# Patient Record
Sex: Male | Born: 1995 | Race: Asian | Hispanic: No | Marital: Single | Smoking: Never smoker
Health system: Southern US, Community
[De-identification: ages and names within clinical notes are randomized; demographics above are authoritative.]

---
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
## 2018-08-23 ENCOUNTER — Encounter (HOSPITAL_COMMUNITY): Payer: Self-pay | Admitting: Emergency Medicine

## 2018-08-23 ENCOUNTER — Ambulatory Visit (HOSPITAL_COMMUNITY)
Admission: EM | Admit: 2018-08-23 | Discharge: 2018-08-23 | Disposition: A | Discharge status: Home / Self Care | Payer: Self-pay | Attending: Family Medicine | Admitting: Family Medicine

## 2018-08-23 DIAGNOSIS — H7292 Unspecified perforation of tympanic membrane, left ear: Secondary | ICD-10-CM

## 2018-08-23 DIAGNOSIS — H6592 Unspecified nonsuppurative otitis media, left ear: Secondary | ICD-10-CM

## 2018-08-23 MED ORDER — AMOXICILLIN 875 MG PO TABS: 875.0000 mg | ORAL_TABLET | Freq: Two times a day (BID) | ORAL | 0 refills | Status: DC

## 2018-08-23 NOTE — ED Triage Notes (Signed)
Pt here for ear wax removal  

## 2018-08-23 NOTE — Discharge Instructions (Signed)
Take antibiotic 2 x a day See ENT in follow up Do not use cotton swab in ear

## 2018-08-23 NOTE — ED Provider Notes (Signed)
[facility]    CSN: [id number] Arrival date & time: 08/23/18  1052     History   Chief Complaint Chief Complaint  Patient presents with  . Cerumen Impaction    HPI Jacob Ferguson is a 22 y.o. male.   HPI  Here for ear problems has had ear infections and wax in the past.  Here for ear pain and drainage L ear for several weeks.  He thinks there is 'trash" In his ear.  He has used a cotton swab.  Family member states he has decreased hearing,  Denies recent URI or congestion. No sore throat.  No allergies.    Seen with the assistance of an interpreter    History reviewed. No pertinent past medical history.  There are no active problems to display for this patient.   History reviewed. No pertinent surgical history.     Home Medications    Prior to Admission medications   Medication Sig Start Date End Date Taking? Authorizing Provider  amoxicillin (AMOXIL) 875 MG tablet Take 1 tablet (875 mg total) by mouth 2 (two) times daily. 08/23/18   Eustace Moore, MD    Family History History reviewed. No pertinent family history. States no heart disease or cancer Social History Social History   Tobacco Use  . Smoking status: Not on file  Substance Use Topics  . Alcohol use: Not on file  . Drug use: Not on file     Allergies   Patient has no known allergies.   Review of Systems Review of Systems  Constitutional: Negative for chills and fever.  HENT: Positive for ear discharge, ear pain and hearing loss. Negative for congestion, postnasal drip, rhinorrhea and sore throat.   Eyes: Negative for pain and visual disturbance.  Respiratory: Negative for cough and shortness of breath.   Cardiovascular: Negative for chest pain and palpitations.  Gastrointestinal: Negative for abdominal pain and vomiting.  Genitourinary: Negative for dysuria and hematuria.  Musculoskeletal: Negative for arthralgias and back pain.  Skin: Negative for color change and rash.    Neurological: Negative for seizures and syncope.  Psychiatric/Behavioral: Negative for sleep disturbance.  All other systems reviewed and are negative.    Physical Exam Triage Vital Signs ED Triage Vitals  Enc Vitals Group     BP 08/23/18 1143 (!) 118/102     Pulse Rate 08/23/18 1143 81     Resp 08/23/18 1143 18     Temp 08/23/18 1143 97.9 F (36.6 C)     Temp Source 08/23/18 1143 Oral     SpO2 08/23/18 1143 99 %   No data found.  Updated Vital Signs BP (!) 118/102 (BP Location: Right Arm) Comment: checked in both arms verified  Pulse 81   Temp 97.9 F (36.6 C) (Oral)   Resp 18   SpO2 99%       Physical Exam  Constitutional: He appears well-developed and well-nourished. No distress.  HENT:  Head: Normocephalic and atraumatic.  Right Ear: External ear normal.  Left Ear: External ear normal.  Nose: Nose normal.  Mouth/Throat: Oropharynx is clear and moist.  The right TM has some scar, but is otherwise clear.  The left TM has a perforation centrally, some yellow dc in the canal.  Mild erythema.  No adenopathy.    Eyes: Pupils are equal, round, and reactive to light. Conjunctivae are normal.  Neck: Normal range of motion.  Cardiovascular: Normal rate.  Pulmonary/Chest: Effort normal. No respiratory distress.  Abdominal: Soft.  He exhibits no distension.  Musculoskeletal: Normal range of motion. He exhibits no edema.  Neurological: He is alert.  Skin: Skin is warm and dry.     UC Treatments / Results  Labs (all labs ordered are listed, but only abnormal results are displayed) Labs Reviewed - No data to display  EKG None  Radiology No results found.  Procedures Procedures (including critical care time)  Medications Ordered in UC Medications - No data to display  Initial Impression / Assessment and Plan / UC Course  I have reviewed the triage vital signs and the nursing notes.  Pertinent labs & imaging results that were available during my care of the  patient were reviewed by me and considered in my medical decision making (see chart for details).     Discussed with the patient and his father that he does not have earwax, rather he had a TM rupture.  This may have been from occult otitis media.  He states that it is painful.  He may have ruptured his eardrum with a Q-tip.  In any event I am going to cover him with antibiotics and send him to an ENT for follow-up. Final Clinical Impressions(s) / UC Diagnoses   Final diagnoses:  Otitis media, serous, tm rupture, left     Discharge Instructions     Take antibiotic 2 x a day See ENT in follow up Do not use cotton swab in ear   ED Prescriptions    Medication Sig Dispense Auth. Provider   amoxicillin (AMOXIL) 875 MG tablet Take 1 tablet (875 mg total) by mouth 2 (two) times daily. 14 tablet Eustace Moore, MD     Controlled Substance Prescriptions [place] Controlled Substance Registry consulted? Not Applicable   Eustace Moore, MD 08/23/18 1239

## 2019-07-06 ENCOUNTER — Other Ambulatory Visit: Payer: Self-pay

## 2019-07-06 DIAGNOSIS — Z20822 Contact with and (suspected) exposure to covid-19: Secondary | ICD-10-CM

## 2019-07-07 LAB — NOVEL CORONAVIRUS, NAA: SARS-CoV-2, NAA: NOT DETECTED

## 2019-07-10 ENCOUNTER — Other Ambulatory Visit: Payer: Self-pay

## 2019-07-10 DIAGNOSIS — Z20822 Contact with and (suspected) exposure to covid-19: Secondary | ICD-10-CM

## 2019-07-11 ENCOUNTER — Telehealth: Payer: Self-pay | Admitting: *Deleted

## 2019-07-11 LAB — NOVEL CORONAVIRUS, NAA: SARS-CoV-2, NAA: NOT DETECTED

## 2019-10-11 ENCOUNTER — Other Ambulatory Visit: Payer: Self-pay

## 2019-10-11 DIAGNOSIS — Z20822 Contact with and (suspected) exposure to covid-19: Secondary | ICD-10-CM

## 2019-10-13 LAB — NOVEL CORONAVIRUS, NAA: SARS-CoV-2, NAA: NOT DETECTED

## 2020-04-13 ENCOUNTER — Encounter (HOSPITAL_COMMUNITY): Payer: Self-pay

## 2020-04-13 ENCOUNTER — Ambulatory Visit (HOSPITAL_COMMUNITY)
Admission: EM | Admit: 2020-04-13 | Discharge: 2020-04-13 | Disposition: A | Discharge status: Home / Self Care | Payer: 59 | Attending: Family Medicine | Admitting: Family Medicine

## 2020-04-13 ENCOUNTER — Other Ambulatory Visit: Payer: Self-pay

## 2020-04-13 DIAGNOSIS — R519 Headache, unspecified: Secondary | ICD-10-CM | POA: Diagnosis not present

## 2020-04-13 MED ORDER — IBUPROFEN 600 MG PO TABS: 600.0000 mg | ORAL_TABLET | Freq: Four times a day (QID) | ORAL | 0 refills | Status: DC | PRN

## 2020-04-13 NOTE — Discharge Instructions (Signed)
Please try the medicine  Please stay well hydrated  Please have your eyes checked by the eye doctor if your headache continues.  Please follow up if your symptoms fail to improve.

## 2020-04-13 NOTE — ED Triage Notes (Signed)
Pt reports headache and feeling ligtheaded when walking for the past 3-4 days. Pt has not take any medication for the complaint. Pt denies any other symptoms.

## 2020-04-13 NOTE — ED Provider Notes (Signed)
[facility]    CSN: [id number] Arrival date & time: 04/13/20  1512      History   Chief Complaint Chief Complaint  Patient presents with  . Headache    HPI Jacob Ferguson is a 24 y.o. male.   He is presenting with a frontal headache.  His symptoms started 4 days ago.  His symptoms seem to be worse when he is at work.  He stands objects at work.  He denies any recent trauma.  Has tried over-the-counter medications with limited improvement.  HPI  History reviewed. No pertinent past medical history.  There are no problems to display for this patient.   History reviewed. No pertinent surgical history.     Home Medications    Prior to Admission medications   Medication Sig Start Date End Date Taking? Authorizing Provider  amoxicillin (AMOXIL) 875 MG tablet Take 1 tablet (875 mg total) by mouth 2 (two) times daily. 08/23/18   Eustace Moore, MD  ibuprofen (ADVIL) 600 MG tablet Take 1 tablet (600 mg total) by mouth every 6 (six) hours as needed. 04/13/20   Myra Rude, MD    Family History History reviewed. No pertinent family history.  Social History Social History   Tobacco Use  . Smoking status: Never Smoker  . Smokeless tobacco: Never Used  Substance Use Topics  . Alcohol use: Not on file  . Drug use: Not on file     Allergies   Patient has no known allergies.   Review of Systems Review of Systems  See HPI  Physical Exam Triage Vital Signs ED Triage Vitals  Enc Vitals Group     BP 04/13/20 1538 (!) 143/77     Pulse Rate 04/13/20 1538 84     Resp 04/13/20 1538 17     Temp 04/13/20 1538 98.3 F (36.8 C)     Temp Source 04/13/20 1538 Oral     SpO2 04/13/20 1538 98 %     Weight --      Height --      Head Circumference --      Peak Flow --      Pain Score 04/13/20 1536 4     Pain Loc --      Pain Edu? --      Excl. in GC? --    No data found.  Updated Vital Signs BP (!) 143/77 (BP Location: Right Arm)   Pulse 84   Temp  98.3 F (36.8 C) (Oral)   Resp 17   SpO2 98%   Visual Acuity Right Eye Distance:   Left Eye Distance:   Bilateral Distance:    Right Eye Near:   Left Eye Near:    Bilateral Near:     Physical Exam Gen: NAD, alert, cooperative with exam, well-appearing ENT: normal lips, normal nasal mucosa, tympanic membrane on right is intact.  There appears to be a chronic perforation of the left tympanic membrane.  Normal oropharynx Eye: normal EOM, normal conjunctiva and lids, pupils equal and reactive CV:  no edema,  Skin: no rashes, no areas of induration  Neuro: normal tone, normal sensation to touch    UC Treatments / Results  Labs (all labs ordered are listed, but only abnormal results are displayed) Labs Reviewed - No data to display  EKG   Radiology No results found.  Procedures Procedures (including critical care time)  Medications Ordered in UC Medications - No data to display  Initial Impression /  Assessment and Plan / UC Course  I have reviewed the triage vital signs and the nursing notes.  Pertinent labs & imaging results that were available during my care of the patient were reviewed by me and considered in my medical decision making (see chart for details).     Mr. Jacob Ferguson is a 24 year old male is presenting with headache.  It is frontal in nature.  Seems more tension.  Could be associated with his eyesight.  Provided ibuprofen.  Advised to be followed up with ophthalmologist if no improvement.  Counseled on supportive care.  Given occasions return to follow-up.  Final Clinical Impressions(s) / UC Diagnoses   Final diagnoses:  Acute nonintractable headache, unspecified headache type     Discharge Instructions     Please try the medicine  Please stay well hydrated  Please have your eyes checked by the eye doctor if your headache continues.  Please follow up if your symptoms fail to improve.     ED Prescriptions    Medication Sig Dispense Auth. Provider     ibuprofen (ADVIL) 600 MG tablet Take 1 tablet (600 mg total) by mouth every 6 (six) hours as needed. 30 tablet Jacob Ax, MD     PDMP not reviewed this encounter.   Jacob Ax, MD 04/13/20 [phone]

## 2020-05-02 ENCOUNTER — Ambulatory Visit (INDEPENDENT_AMBULATORY_CARE_PROVIDER_SITE_OTHER): Payer: 59 | Admitting: Otolaryngology

## 2020-05-02 ENCOUNTER — Other Ambulatory Visit: Payer: Self-pay

## 2020-05-02 VITALS — Temp 97.3°F

## 2020-05-02 DIAGNOSIS — H9041 Sensorineural hearing loss, unilateral, right ear, with unrestricted hearing on the contralateral side: Secondary | ICD-10-CM | POA: Diagnosis not present

## 2020-05-02 DIAGNOSIS — H7292 Unspecified perforation of tympanic membrane, left ear: Secondary | ICD-10-CM | POA: Diagnosis not present

## 2020-05-02 NOTE — Progress Notes (Signed)
HPI: Jacob Ferguson is a 24 y.o. male who presents for evaluation of hearing loss and intermittent drainage from his ears.  Apparently had a lot of ear problems when he was a child.  He lost his hearing as a child in the right ear.  He had previously been evaluated in the [place] over 5 years ago with apparent scan of his head but is not sure what it showed.  He never got hearing aids.  He has had intermittent drainage from the left ear that occurs on a monthly basis.  He is having no drainage today.  No past medical history on file. No past surgical history on file. Social History   Socioeconomic History  . Marital status: Single    Spouse name: Not on file  . Number of children: Not on file  . Years of education: Not on file  . Highest education level: Not on file  Occupational History  . Not on file  Tobacco Use  . Smoking status: Never Smoker  . Smokeless tobacco: Never Used  Substance and Sexual Activity  . Alcohol use: Not on file  . Drug use: Not on file  . Sexual activity: Yes    Birth control/protection: None  Other Topics Concern  . Not on file  Social History Narrative  . Not on file   Social Determinants of Health   Financial Resource Strain:   . Difficulty of Paying Living Expenses:   Food Insecurity:   . Worried About Programme researcher, broadcasting/film/video in the Last Year:   . Barista in the Last Year:   Transportation Needs:   . Freight forwarder (Medical):   Marland Kitchen Lack of Transportation (Non-Medical):   Physical Activity:   . Days of Exercise per Week:   . Minutes of Exercise per Session:   Stress:   . Feeling of Stress :   Social Connections:   . Frequency of Communication with Friends and Family:   . Frequency of Social Gatherings with Friends and Family:   . Attends Religious Services:   . Active Member of Clubs or Organizations:   . Attends Banker Meetings:   Marland Kitchen Marital Status:    No family history on file. No Known Allergies Prior to  Admission medications   Medication Sig Start Date End Date Taking? Authorizing Provider  amoxicillin (AMOXIL) 875 MG tablet Take 1 tablet (875 mg total) by mouth 2 (two) times daily. 08/23/18   Eustace Moore, MD  ibuprofen (ADVIL) 600 MG tablet Take 1 tablet (600 mg total) by mouth every 6 (six) hours as needed. 04/13/20   Myra Rude, MD     Positive ROS: Otherwise negative  All other systems have been reviewed and were otherwise negative with the exception of those mentioned in the HPI and as above.  Physical Exam: Constitutional: Alert, well-appearing, no acute distress Ears: External ears without lesions or tenderness. Ear canals are clear bilaterally.  Right TM is intact and clear.  Left TM reveals a dry central TM perforation small to moderate size.  Middle ear mucosa was clear.  Hearing screening with the 512 1024 tuning fork revealed Weber to lateralize to the left side.  He had reasonable hearing on the left side and very pronounced hearing loss on the right side. Nasal: External nose without lesions. Clear nasal passages Oral: Lips and gums without lesions. Tongue and palate mucosa without lesions. Posterior oropharynx clear. Neck: No palpable adenopathy or masses Respiratory: Breathing  comfortably  Skin: No facial/neck lesions or rash noted.  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  Plan: Discussed with the patient that there is no medical therapy to improve hearing in the right ear but he might be able to use a hearing aid but would have to get audiogram first. Concerning the drainage from the left ear recommended keeping all water out of the left ear and discussed with her that he has a hole in the eardrum. He will follow-up in 1 month for recheck and audiologic testing.  Narda Bonds, MD

## 2020-05-27 ENCOUNTER — Ambulatory Visit (INDEPENDENT_AMBULATORY_CARE_PROVIDER_SITE_OTHER): Payer: 59 | Admitting: Otolaryngology

## 2020-05-27 ENCOUNTER — Other Ambulatory Visit: Payer: Self-pay

## 2020-05-27 DIAGNOSIS — H7292 Unspecified perforation of tympanic membrane, left ear: Secondary | ICD-10-CM | POA: Diagnosis not present

## 2020-05-27 NOTE — Progress Notes (Signed)
HPI: Jacob Ferguson is a 24 y.o. male who returns today for evaluation of hearing loss and intermittent drainage from his left ear.  [facility] on 05/06/2020 and on review of his hearing test it showed minimal conductive loss in the left ear and a large conductive loss in the right ear which I am not sure is correct as Weber testing with the 512 tuning fork lateralized to the left side. Pure tones in the left ear were approximately 20-40 dB and pure tones on the right side were 60-90 dB.Marland Kitchen  No past medical history on file. No past surgical history on file. Social History   Socioeconomic History  . Marital status: Single    Spouse name: Not on file  . Number of children: Not on file  . Years of education: Not on file  . Highest education level: Not on file  Occupational History  . Not on file  Tobacco Use  . Smoking status: Never Smoker  . Smokeless tobacco: Never Used  Substance and Sexual Activity  . Alcohol use: Not on file  . Drug use: Not on file  . Sexual activity: Yes    Birth control/protection: None  Other Topics Concern  . Not on file  Social History Narrative  . Not on file   Social Determinants of Health   Financial Resource Strain:   . Difficulty of Paying Living Expenses:   Food Insecurity:   . Worried About Programme researcher, broadcasting/film/video in the Last Year:   . Barista in the Last Year:   Transportation Needs:   . Freight forwarder (Medical):   Marland Kitchen Lack of Transportation (Non-Medical):   Physical Activity:   . Days of Exercise per Week:   . Minutes of Exercise per Session:   Stress:   . Feeling of Stress :   Social Connections:   . Frequency of Communication with Friends and Family:   . Frequency of Social Gatherings with Friends and Family:   . Attends Religious Services:   . Active Member of Clubs or Organizations:   . Attends Banker Meetings:   Marland Kitchen Marital Status:    No family history on file. No Known  Allergies Prior to Admission medications   Medication Sig Start Date End Date Taking? Authorizing Provider  amoxicillin (AMOXIL) 875 MG tablet Take 1 tablet (875 mg total) by mouth 2 (two) times daily. 08/23/18   Eustace Moore, MD  ibuprofen (ADVIL) 600 MG tablet Take 1 tablet (600 mg total) by mouth every 6 (six) hours as needed. 04/13/20   Myra Rude, MD     Positive ROS: Otherwise negative  All other systems have been reviewed and were otherwise negative with the exception of those mentioned in the HPI and as above.  Physical Exam: Constitutional: Alert, well-appearing, no acute distress Ears: External ears without lesions or tenderness.  Ear examination revealed small left inferior TM perforation with no active drainage and clear middle ear space.  Right TM was intact and clear.  Tuning fork testing with Weber lateralizing to the left and AC equivocal to [facility] on the left. Nasal: External nose without lesions. Clear nasal passages Oral: Lips and gums without lesions. Tongue and palate mucosa without lesions. Posterior oropharynx clear. Neck: No palpable adenopathy or masses Respiratory: Breathing comfortably  Skin: No facial/neck lesions or rash noted.  Procedures  Assessment: Severe right ear SNHL. Left TM perforation and is better hearing ear.  Plan: Recommended hearing aids. Recommend keeping all water out of the left ear. He will follow-up here next time he has drainage from the left ear.   Narda Bonds, MD

## 2020-05-28 ENCOUNTER — Encounter (INDEPENDENT_AMBULATORY_CARE_PROVIDER_SITE_OTHER): Payer: Self-pay

## 2021-02-17 NOTE — Telephone Encounter (Signed)
Error, please disregard.

## 2022-04-14 ENCOUNTER — Other Ambulatory Visit: Payer: Self-pay

## 2022-04-14 ENCOUNTER — Encounter (HOSPITAL_COMMUNITY): Payer: Self-pay | Admitting: Emergency Medicine

## 2022-04-14 ENCOUNTER — Ambulatory Visit (HOSPITAL_COMMUNITY)
Admission: EM | Admit: 2022-04-14 | Discharge: 2022-04-14 | Disposition: A | Discharge status: Home / Self Care | Payer: BC Managed Care – PPO

## 2022-04-14 ENCOUNTER — Emergency Department (HOSPITAL_COMMUNITY)
Admission: EM | Admit: 2022-04-14 | Discharge: 2022-04-14 | Disposition: A | Discharge status: Home / Self Care | Payer: BC Managed Care – PPO | Attending: Emergency Medicine | Admitting: Emergency Medicine

## 2022-04-14 ENCOUNTER — Emergency Department (HOSPITAL_COMMUNITY): Payer: BC Managed Care – PPO

## 2022-04-14 ENCOUNTER — Encounter (HOSPITAL_COMMUNITY): Payer: Self-pay

## 2022-04-14 DIAGNOSIS — N451 Epididymitis: Secondary | ICD-10-CM | POA: Diagnosis not present

## 2022-04-14 DIAGNOSIS — I880 Nonspecific mesenteric lymphadenitis: Secondary | ICD-10-CM | POA: Insufficient documentation

## 2022-04-14 DIAGNOSIS — R0609 Other forms of dyspnea: Secondary | ICD-10-CM | POA: Diagnosis not present

## 2022-04-14 DIAGNOSIS — J069 Acute upper respiratory infection, unspecified: Secondary | ICD-10-CM

## 2022-04-14 DIAGNOSIS — R058 Other specified cough: Secondary | ICD-10-CM | POA: Insufficient documentation

## 2022-04-14 DIAGNOSIS — R1031 Right lower quadrant pain: Secondary | ICD-10-CM

## 2022-04-14 DIAGNOSIS — H6521 Chronic serous otitis media, right ear: Secondary | ICD-10-CM

## 2022-04-14 DIAGNOSIS — R110 Nausea: Secondary | ICD-10-CM | POA: Insufficient documentation

## 2022-04-14 DIAGNOSIS — N50811 Right testicular pain: Secondary | ICD-10-CM | POA: Insufficient documentation

## 2022-04-14 LAB — CBC WITH DIFFERENTIAL/PLATELET
Abs Immature Granulocytes: 0.02 10*3/uL (ref 0.00–0.07)
Basophils Absolute: 0.1 10*3/uL (ref 0.0–0.1)
Basophils Relative: 1 %
Eosinophils Absolute: 0.3 10*3/uL (ref 0.0–0.5)
Eosinophils Relative: 3 %
HCT: 48.5 % (ref 39.0–52.0)
Hemoglobin: 15.7 g/dL (ref 13.0–17.0)
Immature Granulocytes: 0 %
Lymphocytes Relative: 37 %
Lymphs Abs: 2.9 10*3/uL (ref 0.7–4.0)
MCH: 24.3 pg — ABNORMAL LOW (ref 26.0–34.0)
MCHC: 32.4 g/dL (ref 30.0–36.0)
MCV: 75.2 fL — ABNORMAL LOW (ref 80.0–100.0)
Monocytes Absolute: 0.6 10*3/uL (ref 0.1–1.0)
Monocytes Relative: 7 %
Neutro Abs: 4 10*3/uL (ref 1.7–7.7)
Neutrophils Relative %: 52 %
Platelets: 305 10*3/uL (ref 150–400)
RBC: 6.45 MIL/uL — ABNORMAL HIGH (ref 4.22–5.81)
RDW: 14.3 % (ref 11.5–15.5)
WBC: 7.9 10*3/uL (ref 4.0–10.5)
nRBC: 0 % (ref 0.0–0.2)

## 2022-04-14 LAB — COMPREHENSIVE METABOLIC PANEL
ALT: 22 U/L (ref 0–44)
AST: 22 U/L (ref 15–41)
Albumin: 4.6 g/dL (ref 3.5–5.0)
Alkaline Phosphatase: 110 U/L (ref 38–126)
Anion gap: 10 (ref 5–15)
BUN: 12 mg/dL (ref 6–20)
CO2: 27 mmol/L (ref 22–32)
Calcium: 9.8 mg/dL (ref 8.9–10.3)
Chloride: 102 mmol/L (ref 98–111)
Creatinine, Ser: 1.02 mg/dL (ref 0.61–1.24)
GFR, Estimated: >60 mL/min (ref >60)
Glucose, Bld: 95 mg/dL (ref 70–99)
Potassium: 4.1 mmol/L (ref 3.5–5.1)
Sodium: 139 mmol/L (ref 135–145)
Total Bilirubin: 0.6 mg/dL (ref 0.3–1.2)
Total Protein: 7.8 g/dL (ref 6.5–8.1)

## 2022-04-14 LAB — URINALYSIS, ROUTINE W REFLEX MICROSCOPIC
Bilirubin Urine: NEGATIVE
Glucose, UA: NEGATIVE mg/dL
Hgb urine dipstick: NEGATIVE
Ketones, ur: NEGATIVE mg/dL
Leukocytes,Ua: NEGATIVE
Nitrite: NEGATIVE
Protein, ur: NEGATIVE mg/dL
Specific Gravity, Urine: 1.019 (ref 1.005–1.030)
pH: 6 (ref 5.0–8.0)

## 2022-04-14 LAB — LIPASE, BLOOD: Lipase: 28 U/L (ref 11–51)

## 2022-04-14 MED ORDER — IOHEXOL 300 MG/ML  SOLN
100.0000 mL | Freq: Once | INTRAMUSCULAR | Status: AC | PRN
  Administered 2022-04-14: 100 mL via INTRAVENOUS

## 2022-04-14 MED ORDER — ALBUTEROL SULFATE HFA 108 (90 BASE) MCG/ACT IN AERS: 1.0000 | INHALATION_SPRAY | Freq: Four times a day (QID) | RESPIRATORY_TRACT | 1 refills | Status: DC | PRN

## 2022-04-14 MED ORDER — ALBUTEROL SULFATE HFA 108 (90 BASE) MCG/ACT IN AERS
1.0000 | INHALATION_SPRAY | Freq: Once | RESPIRATORY_TRACT | Status: AC
  Administered 2022-04-14: 1 via RESPIRATORY_TRACT
  Filled 2022-04-14: qty 6.7

## 2022-04-14 MED ORDER — ONDANSETRON 4 MG PO TBDP
4.0000 mg | ORAL_TABLET | Freq: Once | ORAL | Status: AC
  Administered 2022-04-14: 4 mg via ORAL
  Filled 2022-04-14: qty 1

## 2022-04-14 NOTE — ED Provider Notes (Signed)
[facility] EMERGENCY DEPARTMENT Provider Note   CSN: [id number] Arrival date & time: 04/14/22  1126     History  Chief Complaint  Patient presents with   Cough   Testicle Pain    Jacob Ferguson is a 26 y.o. male with no significant past medical history, no previous abdominal surgeries who presents with concern for cough, phlegm for 2 weeks to a month.  He reports some right lower quadrant abdominal pain, as well as some right testicle pain, for the last 3 to 4 days.  Versus when he presses on his abdomen he has some nausea without vomiting.  Does not endorse any nausea at this time.  He denies fever, chills.  He denies any swelling of the testicle, penile discharge, recent injury to the testicles.   Cough Testicle Pain       Home Medications Prior to Admission medications   Medication Sig Start Date End Date Taking? Authorizing Provider  albuterol (VENTOLIN HFA) 108 (90 Base) MCG/ACT inhaler Inhale 1-2 puffs into the lungs every 6 (six) hours as needed for wheezing or shortness of breath. 04/14/22  Yes Lucindy Borel H, PA-C      Allergies    Patient has no known allergies.    Review of Systems   Review of Systems  Respiratory:  Positive for cough.   Genitourinary:  Positive for testicular pain.  All other systems reviewed and are negative.   Physical Exam Updated Vital Signs BP 131/79 (BP Location: Right Arm)   Pulse 65   Temp 97.9 F (36.6 C) (Oral)   Resp 18   SpO2 99%  Physical Exam Vitals and nursing note reviewed.  Constitutional:      General: He is not in acute distress.    Appearance: Normal appearance.  HENT:     Head: Normocephalic and atraumatic.  Eyes:     General:        Right eye: No discharge.        Left eye: No discharge.  Cardiovascular:     Rate and Rhythm: Normal rate and regular rhythm.     Heart sounds: No murmur heard.    No friction rub. No gallop.  Pulmonary:     Effort: Pulmonary effort is normal.      Breath sounds: Normal breath sounds.     Comments: No wheezing, rhonchi, rales.  No focal consolidation noted.  No respiratory distress. Abdominal:     General: Bowel sounds are normal.     Palpations: Abdomen is soft.     Comments: Tenderness to palpation in the right lower quadrant of the abdomen without rebound, rigidity, guarding.  Genitourinary:    Comments: Normal appearance of testes externally.  No penile discharge.  Some tenderness palpation of the right testicle.  Intact cremasteric reflex, no evidence of high lying testicle or testicular mass. Skin:    General: Skin is warm and dry.     Capillary Refill: Capillary refill takes less than 2 seconds.  Neurological:     Mental Status: He is alert and oriented to person, place, and time.  Psychiatric:        Mood and Affect: Mood normal.        Behavior: Behavior normal.     ED Results / Procedures / Treatments   Labs (all labs ordered are listed, but only abnormal results are displayed) Labs Reviewed  CBC WITH DIFFERENTIAL/PLATELET - Abnormal; Notable for the following components:      Result Value  RBC 6.45 (*)    MCV 75.2 (*)    MCH 24.3 (*)    All other components within normal limits  COMPREHENSIVE METABOLIC PANEL  LIPASE, BLOOD  URINALYSIS, ROUTINE W REFLEX MICROSCOPIC    EKG None  Radiology CT Abdomen Pelvis W Contrast  Result Date: 04/14/2022 CLINICAL DATA:  Right lower quadrant and right testicular pain EXAM: CT ABDOMEN AND PELVIS WITH CONTRAST TECHNIQUE: Multidetector CT imaging of the abdomen and pelvis was performed using the standard protocol following bolus administration of intravenous contrast. RADIATION DOSE REDUCTION: This exam was performed according to the departmental dose-optimization program which includes automated exposure control, adjustment of the mA and/or kV according to patient size and/or use of iterative reconstruction technique. CONTRAST:  OMNIPAQUE IOHEXOL 300 MG/ML  SOLN  COMPARISON:  Scrotal ultrasound April 14, 2022 FINDINGS: Lower chest: No acute abnormality. Hepatobiliary: No suspicious hepatic lesion. Gallbladder is unremarkable. No biliary ductal dilation. Pancreas: No pancreatic ductal dilation or evidence of acute inflammation. Spleen: No splenomegaly or focal splenic lesion. Adrenals/Urinary Tract: Bilateral adrenal glands appear normal. No hydronephrosis. Kidneys demonstrate symmetric enhancement. Urinary bladder is nondistended limiting evaluation. Stomach/Bowel: No radiopaque enteric contrast material was administered. Stomach is unremarkable for degree of distension. No pathologic dilation of small or large bowel. The appendix and terminal ileum appear normal. No evidence of acute bowel inflammation. Vascular/Lymphatic: Normal caliber abdominal aorta. No pathologically enlarged abdominal or pelvic lymph nodes. Prominent right lower quadrant lymph nodes measure up to 8 mm in short axis are commonly reactive. Reproductive: Prostate gland is unremarkable. Other: No significant abdominopelvic free fluid. No walled off fluid collections. No pneumoperitoneum. Musculoskeletal: No acute or significant osseous findings. IMPRESSION: 1. Prominent right lower quadrant mesenteric lymph nodes measure up to 9 mm in short axis, possibly reflecting mesenteric adenitis. 2. Otherwise, no acute abnormality identified in the abdomen or pelvis. Electronically Signed   By: Maudry Mayhew M.D.   On: 04/14/2022 14:48   US SCROTUM W/DOPPLER  Result Date: 04/14/2022 CLINICAL DATA:  Right testicular pain for the past 5 days. EXAM: SCROTAL ULTRASOUND DOPPLER ULTRASOUND OF THE TESTICLES TECHNIQUE: Complete ultrasound examination of the testicles, epididymis, and other scrotal structures was performed. Color and spectral Doppler ultrasound were also utilized to evaluate blood flow to the testicles. COMPARISON:  None Available. FINDINGS: Right testicle Measurements: 3.4 x 1.9 x 2.3 cm. No mass or  microlithiasis visualized. Left testicle Measurements: 3.3 x 1.8 x 2.5 cm. No mass or microlithiasis visualized. Right epididymis:  Normal in size and appearance. Left epididymis:  Normal in size and appearance. Hydrocele:  None visualized. Varicocele:  None visualized. Pulsed Doppler interrogation of both testes demonstrates normal low resistance arterial and venous waveforms bilaterally. IMPRESSION: 1. Normal testicular ultrasound. Electronically Signed   By: Obie Dredge M.D.   On: 04/14/2022 13:50    Procedures Procedures    Medications Ordered in ED Medications  ondansetron (ZOFRAN-ODT) disintegrating tablet 4 mg (4 mg Oral Given 04/14/22 1239)  iohexol (OMNIPAQUE) 300 MG/ML solution 100 mL (100 mLs Intravenous Contrast Given 04/14/22 1439)  albuterol (VENTOLIN HFA) 108 (90 Base) MCG/ACT inhaler 1 puff (1 puff Inhalation Given 04/14/22 1957)    ED Course/ Medical Decision Making/ A&P                           Medical Decision Making Risk Prescription drug management.   This patient is a 26 y.o. male who presents to the ED for concern of persistent  cough, right lower quadrant pain, testicle pain, this involves an extensive number of treatment options, and is a complaint that carries with it a high risk of complications and morbidity. The emergent differential diagnosis prior to evaluation includes, but is not limited to, pneumonia, acute bronchitis, upper respiratory infection, COVID, other cough, appendicitis, diverticulitis, mesenteric adenitis, other intra-abdominal infection, consider testicular torsion, epididymitis, versus other.  This is not an exhaustive differential.   Past Medical History / Co-morbidities / Social History: Is [place] speaking, language barrier, access to care barrier  Additional history: Chart reviewed. Pertinent results include: Reviewed lab work, imaging from previous emergency department visits, and evaluation for similar issues at urgent care earlier  today  Physical Exam: Physical exam performed. The pertinent findings include: Normal appearance of right testicle tenderness, no high lying testicle, low suspicion for testicular torsion, patient does have some right lower quadrant tenderness.  He has no signs of respiratory abnormality, very mild occasional cough.  Lab Tests: I ordered, and personally interpreted labs.  The pertinent results include: CMP unremarkable, CBC overall unremarkable, slight microcytic quality of red blood cells without anemia.  Urinalysis unremarkable, lipase unremarkable.   Imaging Studies: I ordered imaging studies including CT abdomen pelvis with contrast , ultrasound scrotum with Doppler . I independently visualized and interpreted imaging which showed evidence of mesenteric adenitis, no evidence of testicular torsion, appendicitis, acute or surgical abdomen or other abnormality . I agree with the radiologist interpretation.   Disposition: After consideration of the diagnostic results and the patients response to treatment, I feel that patient's symptoms do not seem to represent an acute bronchitis, however he does have some postviral cough.  We will trial him on albuterol inhaler as needed.  Discussed he can use NSAIDs and time for his mesenteric adenitis.  Encourage follow-up for reevaluation as needed with PCP/urgent care.   Emergency department workup does not suggest an emergent condition requiring admission or immediate intervention beyond what has been performed at this time. The patient is safe for discharge and has been instructed to return immediately for worsening symptoms, change in symptoms or any other concerns.  I discussed this case with my attending physician Dr. Rubin Payor who cosigned this note including patient's presenting symptoms, physical exam, and planned diagnostics and interventions. Attending physician stated agreement with plan or made changes to plan which were implemented.    Final  Clinical Impression(s) / ED Diagnoses Final diagnoses:  Post-viral cough syndrome  Mesenteric adenitis    Rx / DC Orders ED Discharge Orders          Ordered    albuterol (VENTOLIN HFA) 108 (90 Base) MCG/ACT inhaler  Every 6 hours PRN        04/14/22 1945              Chynah Orihuela, Edyth Gunnels 04/14/22 2019    Benjiman Core, MD 04/14/22 2357

## 2022-04-14 NOTE — ED Provider Triage Note (Signed)
Emergency Medicine Provider Triage Evaluation Note  Jacob Ferguson , a 26 y.o. male  was evaluated in triage.  Pt complains of cough and testicular pain. Was evaluated at urgent care and sent to the ED for further evaluation of his symptoms.  Notes that he has had a cough at home.  Also notes nasal congestion.  Denies fever.  Has associated right lower quadrant abdominal pain as well as vomiting this been ongoing for 3 days.  Has associated nausea.  Denies fever, urinary symptoms at this time.  Also notes right testicular pain this been going on for approximately 4 days.  Denies previous abdominal surgeries.  Review of Systems  Positive: As per HPI above Negative:   Physical Exam  BP 117/86 (BP Location: Right Arm)   Pulse 68   Temp 98.6 F (37 C) (Oral)   Resp 16   SpO2 100%  Gen:   Awake, no distress   Resp:  Normal effort  MSK:   Moves extremities without difficulty  Other:  Right lower quadrant abdominal tenderness to palpation.  GU exam deferred in triage today.  Medical Decision Making  Medically screening exam initiated at 12:21 PM.  Appropriate orders placed.  Jacob Ferguson was informed that the remainder of the evaluation will be completed by another provider, this initial triage assessment does not replace that evaluation, and the importance of remaining in the ED until their evaluation is complete.  Work-up initiated.   Jakya Dovidio A, PA-C 04/14/22 1224

## 2022-04-14 NOTE — Discharge Instructions (Signed)
Please use Tylenol or ibuprofen for pain.  You may use 600 mg ibuprofen every 6 hours or 1000 mg of Tylenol every 6 hours.  You may choose to alternate between the 2.  This would be most effective.  Not to exceed 4 g of Tylenol within 24 hours.  Not to exceed 3200 mg ibuprofen 24 hours.  You can use the inhaler to help with the cough, and over-the-counter cough and cold medication.  It should improve over time.  Your abdominal pain is due to some inflammation of lymph nodes and should resolve on its own over the next few weeks, but you can take medications as above to help with pain.  Please return to the emergency department if your symptoms worsen or fail to improve despite treatment.

## 2022-04-14 NOTE — ED Provider Notes (Signed)
[facility]    CSN: [id number] Arrival date & time: 04/14/22  3474      History   Chief Complaint Chief Complaint  Patient presents with   Cough   Testicle Pain    HPI Jacob Ferguson is a 26 y.o. male.   26 year old male presents with painful breathing, abdominal pain right lower quadrant, testicular pain.  Patient speaks broken [place], main language is [place].  Patient relates for the past 4 weeks he has been having increasing cough, chest congestion, with severe pain on deep breathing.  Patient relates that it is intermittent but it has become more progressive over the past couple weeks.  Patient relates that he does have some production associated.  Patient denies wheezing, but does have some shortness of breath with the episodes. Patient relates that he has also had some upper respiratory congestion, rhinitis that is mainly clear, and intermittent nasal congestion where it is difficult for him to breathe through his nostrils.  Patient indicates he has had recurrent ear infections of both ears over the years.  Patient has recently gotten new hearing aids.  Patient does indicate that he has had some bilateral ear pain, with a history of the left ear having frequent infections with drainage. Patient also relates for the past several days he has been having testicular pain, it hurts in the testes when he walks.  But he has not having any painful urination.  Patient indicates that he is having a lot of abdominal pain which is right lower quadrant, with some radiation down into the suprapubic area.  This seems to have started over the past 48 hours.  Patient denies fever and he has not had any vomiting.   Cough Associated symptoms: ear pain (both ears)   Testicle Pain Associated symptoms include abdominal pain (right lower quadrant).    History reviewed. No pertinent past medical history.  There are no problems to display for this patient.   History reviewed. No  pertinent surgical history.     Home Medications    Prior to Admission medications   Not on File    Family History History reviewed. No pertinent family history.  Social History Social History   Tobacco Use   Smoking status: Never   Smokeless tobacco: Never     Allergies   Patient has no known allergies.   Review of Systems Review of Systems  HENT:  Positive for ear pain (both ears) and postnasal drip.   Respiratory:  Positive for cough.   Gastrointestinal:  Positive for abdominal pain (right lower quadrant).  Genitourinary:  Positive for testicular pain.     Physical Exam Triage Vital Signs ED Triage Vitals [04/14/22 1036]  Enc Vitals Group     BP 123/79     Pulse Rate 65     Resp 18     Temp 98.1 F (36.7 C)     Temp Source Oral     SpO2 100 %     Weight      Height      Head Circumference      Peak Flow      Pain Score 8     Pain Loc      Pain Edu?      Excl. in GC?    No data found.  Updated Vital Signs BP 123/79 (BP Location: Left Arm)   Pulse 65   Temp 98.1 F (36.7 C) (Oral)   Resp 18   SpO2 100%  Visual Acuity Right Eye Distance:   Left Eye Distance:   Bilateral Distance:    Right Eye Near:   Left Eye Near:    Bilateral Near:     Physical Exam Constitutional:      Appearance: Normal appearance.  HENT:     Right Ear: Ear canal normal. Tympanic membrane is erythematous.     Left Ear: Ear canal normal. Tympanic membrane is perforated and erythematous.     Mouth/Throat:     Mouth: Mucous membranes are moist.     Pharynx: Oropharynx is clear. Uvula midline.  Cardiovascular:     Rate and Rhythm: Normal rate and regular rhythm.     Heart sounds: Normal heart sounds.  Pulmonary:     Effort: Pulmonary effort is normal.     Breath sounds: Normal breath sounds.     Comments: Pulmonary: When requesting the patient to take in deep inspirations, patient has severe pain on doing so and this limits his ability to breathing deep.   Patient also has to wait for about a minute to recover from breathing deep, indicating that he has this severe pain each time with deep inspiration. Abdominal:     General: Abdomen is flat. Bowel sounds are normal.     Palpations: Abdomen is soft.     Tenderness: There is abdominal tenderness in the right lower quadrant and suprapubic area. There is guarding and rebound. Positive signs include McBurney's sign.     Comments: Abdomen: Right lower quadrant tenderness is present at Burney's point with guarding associated.  Lymphadenopathy:     Cervical: No cervical adenopathy.  Neurological:     Mental Status: He is alert.      UC Treatments / Results  Labs (all labs ordered are listed, but only abnormal results are displayed) Labs Reviewed - No data to display  EKG   Radiology No results found.  Procedures Procedures (including critical care time)  Medications Ordered in UC Medications - No data to display  Initial Impression / Assessment and Plan / UC Course  I have reviewed the triage vital signs and the nursing notes.  Pertinent labs & imaging results that were available during my care of the patient were reviewed by me and considered in my medical decision making (see chart for details).    Plan: 1.  Patient has been advised to report to the emergency room for evaluation of the dyspnea and the right lower quadrant abdominal pain. 2. Patient has a otitis media and an epididymitis that may need to be treated, however the main concern is to address the right lower quadrant abdominal pain and the painful breathing. 3.  Patient advised to follow-up with PCP or return to urgent care if symptoms fail to improve. Final Clinical Impressions(s) / UC Diagnoses   Final diagnoses:  Epididymitis  Right lower quadrant abdominal pain  Other form of dyspnea  Viral URI with cough  Right chronic serous otitis media     Discharge Instructions      **Advise to report to the  emergency room for evaluation of the right lower quadrant abdominal pain and evaluation of the severe respiratory pain on deep inspiration.  (Possible acute appendicitis.)  Follow-up PCP or return to urgent care if symptoms fail to improve.      ED Prescriptions   None    PDMP not reviewed this encounter.   Ellsworth Lennox, PA-C 04/14/22 1128

## 2022-04-14 NOTE — ED Triage Notes (Signed)
Pt states he has had a cough and phlegm for a month. Pt also complains of his right testicle hurting for a few days. Denies swelling/redness

## 2022-04-14 NOTE — ED Notes (Signed)
ED Provider at bedside. 

## 2022-04-14 NOTE — Discharge Instructions (Addendum)
**  Advise to report to the emergency room for evaluation of the right lower quadrant abdominal pain and evaluation of the severe respiratory pain on deep inspiration.  (Possible acute appendicitis.)  Follow-up PCP or return to urgent care if symptoms fail to improve.

## 2022-04-14 NOTE — ED Triage Notes (Signed)
Pt c/o productive cough with yellow sputum for over a month. States coughing till he vomits. Denis taking any OTC meds.   Pt c/o rt testicle tenderness to touch x3-4 days. Denies urinary sx's.

## 2022-05-09 ENCOUNTER — Ambulatory Visit
Admission: EM | Admit: 2022-05-09 | Discharge: 2022-05-09 | Disposition: A | Discharge status: Home / Self Care | Payer: BC Managed Care – PPO | Attending: Emergency Medicine | Admitting: Emergency Medicine

## 2022-05-09 DIAGNOSIS — R112 Nausea with vomiting, unspecified: Secondary | ICD-10-CM | POA: Diagnosis not present

## 2022-05-09 MED ORDER — ALBUTEROL SULFATE HFA 108 (90 BASE) MCG/ACT IN AERS: 1.0000 | INHALATION_SPRAY | Freq: Four times a day (QID) | RESPIRATORY_TRACT | 1 refills | Status: AC | PRN

## 2022-05-09 MED ORDER — ONDANSETRON 8 MG PO TBDP: 8.0000 mg | ORAL_TABLET | Freq: Three times a day (TID) | ORAL | 0 refills | Status: DC | PRN

## 2022-05-09 MED ORDER — ONDANSETRON 8 MG PO TBDP
8.0000 mg | ORAL_TABLET | Freq: Once | ORAL | Status: AC
  Administered 2022-05-09: 8 mg via ORAL

## 2022-05-09 NOTE — Discharge Instructions (Signed)
You received a dose of Zofran during your visit today that should resolve your nausea.  I also sent a prescription to your pharmacy for Zofran that you can take every 8 hours.  Your next dose will be due around 11 PM tonight if you need it.  I also renewed your albuterol for your cough.  Please continue to inhale 2 puffs 4 times daily as needed for coughing.  Thank you for visiting urgent care today.

## 2022-05-09 NOTE — ED Provider Notes (Signed)
[facility]    CSN: [id number] Arrival date & time: 05/09/22  1507    HISTORY   Chief Complaint  Patient presents with   Cough   Vomiting   HPI Jacob Ferguson is a pleasant, 26 y.o. male who presents to urgent care today complaining of coughing and vomiting food, states this began yesterday.  Patient has had a cough is not new.  Patient states yesterday he also noticed that he has a thick white coating on his tongue.  Patient demonstrates this to me however at this time, he does not have a thick white coating on his tongue.  Patient states has been taking Tylenol with little relief of his symptoms.  Patient states he is run out of the albuterol that was provided to him during his ED visit a month ago.  Patient states this medication is helpful.  The history is provided by the patient.   History reviewed. No pertinent past medical history. There are no problems to display for this patient.  History reviewed. No pertinent surgical history.  Home Medications    Prior to Admission medications   Medication Sig Start Date End Date Taking? Authorizing Provider  ondansetron (ZOFRAN-ODT) 8 MG disintegrating tablet Take 1 tablet (8 mg total) by mouth every 8 (eight) hours as needed for nausea or vomiting. 05/09/22  Yes Theadora Rama Scales, PA-C  albuterol (VENTOLIN HFA) 108 (90 Base) MCG/ACT inhaler Inhale 1-2 puffs into the lungs every 6 (six) hours as needed for wheezing or shortness of breath. 05/09/22   Theadora Rama Scales, PA-C    Family History History reviewed. No pertinent family history. Social History Social History   Tobacco Use   Smoking status: Never   Smokeless tobacco: Never  Substance Use Topics   Alcohol use: Not Currently   Allergies   Patient has no known allergies.  Review of Systems Review of Systems Pertinent findings revealed after performing a 14 point review of systems has been noted in the history of present illness.  Physical Exam Triage  Vital Signs ED Triage Vitals  Enc Vitals Group     BP 08/22/21 0827 (!) 147/82     Pulse Rate 08/22/21 0827 72     Resp 08/22/21 0827 18     Temp 08/22/21 0827 98.3 F (36.8 C)     Temp Source 08/22/21 0827 Oral     SpO2 08/22/21 0827 98 %     Weight --      Height --      Head Circumference --      Peak Flow --      Pain Score 08/22/21 0826 5     Pain Loc --      Pain Edu? --      Excl. in GC? --   No data found.  Updated Vital Signs BP 121/82 (BP Location: Left Arm)   Pulse 69   Temp 98.2 F (36.8 C) (Oral)   Resp 16   SpO2 99%   Physical Exam Vitals and nursing note reviewed.  Constitutional:      General: He is not in acute distress.    Appearance: Normal appearance. He is not ill-appearing.  HENT:     Head: Normocephalic and atraumatic.     Salivary Glands: Right salivary gland is not diffusely enlarged or tender. Left salivary gland is not diffusely enlarged or tender.     Right Ear: Tympanic membrane, ear canal and external ear normal. No drainage. No middle ear effusion. There  is no impacted cerumen. Tympanic membrane is not erythematous or bulging.     Left Ear: Tympanic membrane, ear canal and external ear normal. No drainage.  No middle ear effusion. There is no impacted cerumen. Tympanic membrane is not erythematous or bulging.     Nose: Nose normal. No nasal deformity, septal deviation, mucosal edema, congestion or rhinorrhea.     Right Turbinates: Not enlarged, swollen or pale.     Left Turbinates: Not enlarged, swollen or pale.     Right Sinus: No maxillary sinus tenderness or frontal sinus tenderness.     Left Sinus: No maxillary sinus tenderness or frontal sinus tenderness.     Mouth/Throat:     Lips: Pink. No lesions.     Mouth: Mucous membranes are moist. No oral lesions.     Pharynx: Oropharynx is clear. Uvula midline. No posterior oropharyngeal erythema or uvula swelling.     Tonsils: No tonsillar exudate. 0 on the right. 0 on the left.  Eyes:      General: Lids are normal.        Right eye: No discharge.        Left eye: No discharge.     Extraocular Movements: Extraocular movements intact.     Conjunctiva/sclera: Conjunctivae normal.     Right eye: Right conjunctiva is not injected.     Left eye: Left conjunctiva is not injected.  Neck:     Trachea: Trachea and phonation normal.  Cardiovascular:     Rate and Rhythm: Normal rate and regular rhythm.     Pulses: Normal pulses.     Heart sounds: Normal heart sounds. No murmur heard.    No friction rub. No gallop.  Pulmonary:     Effort: Pulmonary effort is normal. No accessory muscle usage, prolonged expiration or respiratory distress.     Breath sounds: Normal breath sounds. No stridor, decreased air movement or transmitted upper airway sounds. No decreased breath sounds, wheezing, rhonchi or rales.  Chest:     Chest wall: No tenderness.  Abdominal:     General: Abdomen is flat. Bowel sounds are normal. There is no distension.     Palpations: Abdomen is soft.     Tenderness: There is no abdominal tenderness. There is no right CVA tenderness or left CVA tenderness.     Hernia: No hernia is present.  Musculoskeletal:        General: Normal range of motion.     Cervical back: Normal range of motion and neck supple. Normal range of motion.  Lymphadenopathy:     Cervical: No cervical adenopathy.  Skin:    General: Skin is warm and dry.     Findings: No erythema or rash.  Neurological:     General: No focal deficit present.     Mental Status: He is alert and oriented to person, place, and time.  Psychiatric:        Mood and Affect: Mood normal.        Behavior: Behavior normal.     Visual Acuity Right Eye Distance:   Left Eye Distance:   Bilateral Distance:    Right Eye Near:   Left Eye Near:    Bilateral Near:     UC Couse / Diagnostics / Procedures:     Radiology No results found.  Procedures Procedures (including critical care time) EKG  Pending results:   Labs Reviewed - No data to display  Medications Ordered in UC: Medications  ondansetron (ZOFRAN-ODT) disintegrating tablet 8  mg (has no administration in time range)    UC Diagnoses / Final Clinical Impressions(s)   I have reviewed the triage vital signs and the nursing notes.  Pertinent labs & imaging results that were available during my care of the patient were reviewed by me and considered in my medical decision making (see chart for details).    Final diagnoses:  Nausea and vomiting, unspecified vomiting type   Patient provided with Zofran during his visit today along with a prescription.  I have renewed his prescription for albuterol.  Patient advised to follow-up with primary care.  ED Prescriptions     Medication Sig Dispense Auth. Provider   albuterol (VENTOLIN HFA) 108 (90 Base) MCG/ACT inhaler Inhale 1-2 puffs into the lungs every 6 (six) hours as needed for wheezing or shortness of breath. 8 g Theadora Rama Scales, PA-C   ondansetron (ZOFRAN-ODT) 8 MG disintegrating tablet Take 1 tablet (8 mg total) by mouth every 8 (eight) hours as needed for nausea or vomiting. 20 tablet Theadora Rama Scales, PA-C      PDMP not reviewed this encounter.  Pending results:  Labs Reviewed - No data to display  Discharge Instructions:   Discharge Instructions      You received a dose of Zofran during your visit today that should resolve your nausea.  I also sent a prescription to your pharmacy for Zofran that you can take every 8 hours.  Your next dose will be due around 11 PM tonight if you need it.  I also renewed your albuterol for your cough.  Please continue to inhale 2 puffs 4 times daily as needed for coughing.  Thank you for visiting urgent care today.    Disposition Upon Discharge:  Condition: stable for discharge home  Patient presented with an acute illness with associated systemic symptoms and significant discomfort requiring urgent management. In my opinion,  this is a condition that a prudent lay person (someone who possesses an average knowledge of health and medicine) may potentially expect to result in complications if not addressed urgently such as respiratory distress, impairment of bodily function or dysfunction of bodily organs.   Routine symptom specific, illness specific and/or disease specific instructions were discussed with the patient and/or caregiver at length.   As such, the patient has been evaluated and assessed, work-up was performed and treatment was provided in alignment with urgent care protocols and evidence based medicine.  Patient/parent/caregiver has been advised that the patient may require follow up for further testing and treatment if the symptoms continue in spite of treatment, as clinically indicated and appropriate.  Patient/parent/caregiver has been advised to return to the [facility] or PCP if no better; to PCP or the Emergency Department if new signs and symptoms develop, or if the current signs or symptoms continue to change or worsen for further workup, evaluation and treatment as clinically indicated and appropriate  The patient will follow up with their current PCP if and as advised. If the patient does not currently have a PCP we will assist them in obtaining one.   The patient may need specialty follow up if the symptoms continue, in spite of conservative treatment and management, for further workup, evaluation, consultation and treatment as clinically indicated and appropriate.   Patient/parent/caregiver verbalized understanding and agreement of plan as discussed.  All questions were addressed during visit.  Please see discharge instructions below for further details of plan.  This office note has been dictated using Teaching laboratory technician.  Unfortunately,  this method of dictation can sometimes lead to typographical or grammatical errors.  I apologize for your inconvenience in advance if this occurs.  Please  do not hesitate to reach out to me if clarification is needed.      Theadora Rama Scales, PA-C 05/09/22 1535

## 2022-05-09 NOTE — ED Triage Notes (Signed)
The patient states he has a white coat on his tongue, a cough and vomiting.  Started: a week ago  Home interventions: tylenol

## 2022-08-02 ENCOUNTER — Encounter (HOSPITAL_COMMUNITY): Payer: Self-pay | Admitting: *Deleted

## 2022-08-02 ENCOUNTER — Ambulatory Visit (HOSPITAL_COMMUNITY)
Admission: EM | Admit: 2022-08-02 | Discharge: 2022-08-02 | Disposition: A | Discharge status: Home / Self Care | Payer: BC Managed Care – PPO | Attending: Physician Assistant | Admitting: Physician Assistant

## 2022-08-02 ENCOUNTER — Other Ambulatory Visit: Payer: Self-pay

## 2022-08-02 DIAGNOSIS — B370 Candidal stomatitis: Secondary | ICD-10-CM | POA: Diagnosis not present

## 2022-08-02 DIAGNOSIS — H7292 Unspecified perforation of tympanic membrane, left ear: Secondary | ICD-10-CM

## 2022-08-02 MED ORDER — CLOTRIMAZOLE 10 MG MT TROC: 10.0000 mg | Freq: Three times a day (TID) | OROMUCOSAL | 0 refills | Status: DC

## 2022-08-02 NOTE — ED Triage Notes (Signed)
PT refused translator during triage.

## 2022-08-02 NOTE — ED Triage Notes (Signed)
Pt reports  for one month his tongue  is white and the roof of his mouth is sore.

## 2022-08-02 NOTE — ED Provider Notes (Signed)
[facility]    CSN: [id number] Arrival date & time: 08/02/22  1520      History   Chief Complaint Chief Complaint  Patient presents with   Sore Throat    HPI Jettson Crable is a 26 y.o. male.   Patient presents today with a 1 month history of sores in his mouth and film on his tongue.  He declined interpreter during visit.  He has not tried any over-the-counter medication for symptom management.  He has tried scraping his tongue without improvement of symptoms.  He denies history of HIV or immunosuppression.  Denies history of diabetes.  Does not take inhaled steroids or systemic steroids on a regular basis.  Denies any recent antibiotics.  He has not tried any over-the-counter medication for symptom management.    No past medical history on file.  There are no problems to display for this patient.   No past surgical history on file.     Home Medications    Prior to Admission medications   Medication Sig Start Date End Date Taking? Authorizing Provider  clotrimazole (MYCELEX) 10 MG troche Take 1 tablet (10 mg total) by mouth 3 (three) times daily. 08/02/22  Yes Dayquan Buys K, PA-C  albuterol (VENTOLIN HFA) 108 (90 Base) MCG/ACT inhaler Inhale 1-2 puffs into the lungs every 6 (six) hours as needed for wheezing or shortness of breath. 05/09/22   Theadora Rama Scales, PA-C  ondansetron (ZOFRAN-ODT) 8 MG disintegrating tablet Take 1 tablet (8 mg total) by mouth every 8 (eight) hours as needed for nausea or vomiting. 05/09/22   Theadora Rama Scales, PA-C    Family History No family history on file.  Social History Social History   Tobacco Use   Smoking status: Never   Smokeless tobacco: Never  Substance Use Topics   Alcohol use: Not Currently     Allergies   Patient has no known allergies.   Review of Systems Review of Systems  Constitutional:  Positive for activity change. Negative for appetite change, fatigue and fever.  HENT:  Positive for mouth  sores and sore throat. Negative for congestion, sinus pressure, sneezing, trouble swallowing and voice change.   Respiratory:  Negative for cough and shortness of breath.   Cardiovascular:  Negative for chest pain.  Gastrointestinal:  Negative for abdominal pain, diarrhea, nausea and vomiting.     Physical Exam Triage Vital Signs ED Triage Vitals  Enc Vitals Group     BP 08/02/22 1631 (!) 123/90     Pulse Rate 08/02/22 1631 73     Resp 08/02/22 1631 16     Temp 08/02/22 1631 98.8 F (37.1 C)     Temp src --      SpO2 08/02/22 1631 100 %     Weight --      Height --      Head Circumference --      Peak Flow --      Pain Score 08/02/22 1629 6     Pain Loc --      Pain Edu? --      Excl. in GC? --    No data found.  Updated Vital Signs BP (!) 123/90   Pulse 73   Temp 98.8 F (37.1 C)   Resp 16   SpO2 100%   Visual Acuity Right Eye Distance:   Left Eye Distance:   Bilateral Distance:    Right Eye Near:   Left Eye Near:    Bilateral Near:  Physical Exam Vitals reviewed.  Constitutional:      General: He is awake.     Appearance: Normal appearance. He is well-developed. He is not ill-appearing.     Comments: Very pleasant male appears stated age in no acute distress  HENT:     Head: Normocephalic and atraumatic.     Right Ear: Tympanic membrane, ear canal and external ear normal. Tympanic membrane is not erythematous or bulging.     Left Ear: Ear canal and external ear normal. Tympanic membrane is perforated. Tympanic membrane is not erythematous or bulging.     Ears:     Comments: Patient has chronic perforation of left TM    Nose: Nose normal.     Mouth/Throat:     Pharynx: Uvula midline. No oropharyngeal exudate or posterior oropharyngeal erythema.     Comments: Thick white plaque noted on tongue that is removable with tongue depressor.  No significant oral lesions noted on exam. Cardiovascular:     Rate and Rhythm: Normal rate and regular rhythm.      Heart sounds: Normal heart sounds, S1 normal and S2 normal. No murmur heard. Pulmonary:     Effort: Pulmonary effort is normal. No accessory muscle usage or respiratory distress.     Breath sounds: Normal breath sounds. No stridor. No wheezing, rhonchi or rales.     Comments: Clear to auscultation bilaterally Abdominal:     General: Bowel sounds are normal.     Palpations: Abdomen is soft.     Tenderness: There is no abdominal tenderness.  Neurological:     Mental Status: He is alert.  Psychiatric:        Behavior: Behavior is cooperative.      UC Treatments / Results  Labs (all labs ordered are listed, but only abnormal results are displayed) Labs Reviewed - No data to display  EKG   Radiology No results found.  Procedures Procedures (including critical care time)  Medications Ordered in UC Medications - No data to display  Initial Impression / Assessment and Plan / UC Course  I have reviewed the triage vital signs and the nursing notes.  Pertinent labs & imaging results that were available during my care of the patient were reviewed by me and considered in my medical decision making (see chart for details).     Concern for thrush given clinical presentation.  Will use clotrimazole torche.  Recommended he drink plenty of fluid discussed the importance of oral hygiene.  If his symptoms are not improving he is to return for reevaluation.  TM perforated on left based on physical exam.  Patient reports this is chronic.  He is not established with an ENT in the area but is interested in potentially having this repaired.  He was given contact information for an ENT with instruction to call to schedule an appointment.  Discussed alarm symptoms that warrant emergent evaluation.  Final Clinical Impressions(s) / UC Diagnoses   Final diagnoses:  Thrush  Perforated tympanic membrane, left     Discharge Instructions      Use clotrimazole torche 3 times daily.  Make sure to  brush your teeth 2-3 times and use Listerine mouthwash.  Make sure you are drinking plenty of fluid.  Please call and schedule appointment with [facility] ENT to evaluate your eardrum.     ED Prescriptions     Medication Sig Dispense Auth. Provider   clotrimazole (MYCELEX) 10 MG troche Take 1 tablet (10 mg total) by mouth 3 (three)  times daily. 21 Troche Star Cheese, Noberto Retort, PA-C      PDMP not reviewed this encounter.   Jeani Hawking, PA-C 08/02/22 1653

## 2022-08-02 NOTE — Discharge Instructions (Signed)
Use clotrimazole torche 3 times daily.  Make sure to brush your teeth 2-3 times and use Listerine mouthwash.  Make sure you are drinking plenty of fluid.  Please call and schedule appointment with [facility] ENT to evaluate your eardrum.

## 2022-08-04 ENCOUNTER — Emergency Department (HOSPITAL_COMMUNITY)
Admission: EM | Admit: 2022-08-04 | Discharge: 2022-08-04 | Disposition: A | Discharge status: Home / Self Care | Payer: BC Managed Care – PPO | Attending: Emergency Medicine | Admitting: Emergency Medicine

## 2022-08-04 ENCOUNTER — Encounter (HOSPITAL_COMMUNITY): Payer: Self-pay

## 2022-08-04 DIAGNOSIS — H9202 Otalgia, left ear: Secondary | ICD-10-CM | POA: Diagnosis present

## 2022-08-04 DIAGNOSIS — H66002 Acute suppurative otitis media without spontaneous rupture of ear drum, left ear: Secondary | ICD-10-CM

## 2022-08-04 MED ORDER — AMOXICILLIN 500 MG PO CAPS: 500.0000 mg | ORAL_CAPSULE | Freq: Two times a day (BID) | ORAL | 0 refills | Status: AC

## 2022-08-04 MED ORDER — IBUPROFEN 800 MG PO TABS: 800.0000 mg | ORAL_TABLET | Freq: Three times a day (TID) | ORAL | 0 refills | Status: DC

## 2022-08-04 NOTE — ED Provider Notes (Signed)
[facility] DEPT Provider Note   CSN: [id number] Arrival date & time: 08/04/22  0931     History  Chief Complaint  Patient presents with   Ear Pain    Jacob Ferguson is a 26 y.o. male presenting with left ear pain.  Reports he has seen a specialist who said that he needs surgery.  No decreased hearing, just pain   History taking difficult secondary to language barrier.  Patient repeatedly declines interpreter.  HPI     Home Medications Prior to Admission medications   Medication Sig Start Date End Date Taking? Authorizing Provider  albuterol (VENTOLIN HFA) 108 (90 Base) MCG/ACT inhaler Inhale 1-2 puffs into the lungs every 6 (six) hours as needed for wheezing or shortness of breath. 05/09/22   Lynden Oxford Scales, PA-C  clotrimazole (MYCELEX) 10 MG troche Take 1 tablet (10 mg total) by mouth 3 (three) times daily. 08/02/22   Raspet, Derry Skill, PA-C  ondansetron (ZOFRAN-ODT) 8 MG disintegrating tablet Take 1 tablet (8 mg total) by mouth every 8 (eight) hours as needed for nausea or vomiting. 05/09/22   Lynden Oxford Scales, PA-C      Allergies    Patient has no known allergies.    Review of Systems   Review of Systems  Physical Exam Updated Vital Signs BP 136/86 (BP Location: Left Arm)   Pulse 73   Temp 98.4 F (36.9 C) (Oral)   Resp 16   SpO2 95%  Physical Exam Vitals and nursing note reviewed.  Constitutional:      General: He is not in acute distress.    Appearance: Normal appearance. He is not ill-appearing.  HENT:     Head: Normocephalic and atraumatic.     Right Ear: Tympanic membrane, ear canal and external ear normal.     Ears:     Comments: Right-sided erythematous ear canal.  Bulging TM, intact Eyes:     General: No scleral icterus.    Conjunctiva/sclera: Conjunctivae normal.  Pulmonary:     Effort: Pulmonary effort is normal. No respiratory distress.  Skin:    Findings: No rash.  Neurological:     Mental Status: He is  alert.  Psychiatric:        Mood and Affect: Mood normal.     ED Results / Procedures / Treatments   Labs (all labs ordered are listed, but only abnormal results are displayed) Labs Reviewed - No data to display  EKG None  Radiology No results found.  Procedures Procedures   Medications Ordered in ED Medications - No data to display  ED Course/ Medical Decision Making/ A&P                           Medical Decision Making  26 year old male presenting with left-sided otalgia.  History limited secondary to language barrier but patient continues to decline interpreter.  Ear exam consistent with otitis media.  No mastoid, tragus or auricle tenderness.  No signs of stomach illness.  Will be discharged with antibiotics, ibuprofen and ENT follow-up Final Clinical Impression(s) / ED Diagnoses Final diagnoses:  Non-recurrent acute suppurative otitis media of left ear without spontaneous rupture of tympanic membrane    Rx / DC Orders ED Discharge Orders          Ordered    amoxicillin (AMOXIL) 500 MG capsule  2 times daily        08/04/22 1026    ibuprofen (ADVIL) 800  MG tablet  3 times daily        08/04/22 1026           Results and diagnoses were explained to the patient. Return precautions discussed in full. Patient had no additional questions and expressed complete understanding.   This chart was dictated using voice recognition software.  Despite best efforts to proofread,  errors can occur which can change the documentation meaning.     Rhae Hammock, PA-C 08/04/22 1027    Cristie Hem, MD 08/06/22 [phone]

## 2022-08-04 NOTE — Discharge Instructions (Addendum)
You may use ibuprofen for your pain.  Tylenol was also prescribed.  Amoxicillin is the antibiotic I am starting you on.  Take this with food as it may upset your stomach.  Follow-up with ENT as needed, there is a number attached to these papers.

## 2022-08-04 NOTE — ED Triage Notes (Signed)
Pt arrived via POV, c/o left ear pain. States this is ongoing issue, he has seen ENT and told her needs surgery.

## 2023-10-21 ENCOUNTER — Emergency Department (HOSPITAL_COMMUNITY)
Admission: EM | Admit: 2023-10-21 | Discharge: 2023-10-22 | Disposition: A | Discharge status: Home / Self Care | Payer: BC Managed Care – PPO | Attending: Emergency Medicine | Admitting: Emergency Medicine

## 2023-10-21 DIAGNOSIS — R0602 Shortness of breath: Secondary | ICD-10-CM | POA: Diagnosis present

## 2023-10-21 DIAGNOSIS — B349 Viral infection, unspecified: Secondary | ICD-10-CM | POA: Insufficient documentation

## 2023-10-21 DIAGNOSIS — Z1152 Encounter for screening for COVID-19: Secondary | ICD-10-CM | POA: Diagnosis not present

## 2023-10-21 LAB — RESP PANEL BY RT-PCR (RSV, FLU A&B, COVID)  RVPGX2
Influenza A by PCR: NEGATIVE
Influenza B by PCR: NEGATIVE
Resp Syncytial Virus by PCR: NEGATIVE
SARS Coronavirus 2 by RT PCR: NEGATIVE

## 2023-10-21 LAB — GROUP A STREP BY PCR: Group A Strep by PCR: NOT DETECTED

## 2023-10-21 NOTE — ED Triage Notes (Signed)
Pt c/o URI symptoms including cough, sore throat, fever, malaise per pt

## 2023-10-22 ENCOUNTER — Emergency Department (HOSPITAL_COMMUNITY): Payer: BC Managed Care – PPO

## 2023-10-22 NOTE — Discharge Instructions (Signed)
You were evaluated in the Emergency Department and after careful evaluation, we did not find any emergent condition requiring admission or further testing in the hospital.  Your exam/testing today is overall reassuring.  Symptoms likely due to a viral illness.  Your heart and lungs looked normal.  Recommend fluids, rest, Tylenol and Motrin for discomfort.  Please return to the Emergency Department if you experience any worsening of your condition.   Thank you for allowing [place] to be a part of your care.

## 2023-10-22 NOTE — ED Provider Notes (Signed)
[facility] Emergency Department Provider Note MRN:  [id number]  Arrival date & time: 10/22/23     Chief Complaint   Shortness of Breath   History of Present Illness   Jacob Ferguson is a 27 y.o. year-old male with no pertinent past medical history presenting to the ED with chief complaint of shortness of breath.  Cough and shortness of breath and feeling very sweaty and hot earlier today.  Some sore throat recently.  Just does not hurt, no abdominal pain, no rash.  Review of Systems  A thorough review of systems was obtained and all systems are negative except as noted in the HPI and PMH.   Patient's Health History   No past medical history on file.  No past surgical history on file.  No family history on file.  Social History   Socioeconomic History   Marital status: Single    Spouse name: Not on file   Number of children: Not on file   Years of education: Not on file   Highest education level: Not on file  Occupational History   Not on file  Tobacco Use   Smoking status: Never   Smokeless tobacco: Never  Substance and Sexual Activity   Alcohol use: Not Currently   Drug use: Not on file   Sexual activity: Yes    Birth control/protection: None  Other Topics Concern   Not on file  Social History Narrative   Not on file   Social Drivers of Health   Financial Resource Strain: Not on file  Food Insecurity: Not on file  Transportation Needs: Not on file  Physical Activity: Not on file  Stress: Not on file  Social Connections: Not on file  Intimate Partner Violence: Not on file     Physical Exam   Vitals:   10/21/23 2155 10/22/23 0143  BP: (!) 141/99 128/77  Pulse: 84 67  Resp: 18 20  Temp: 98 F (36.7 C) 98.7 F (37.1 C)  SpO2: 100% 99%    CONSTITUTIONAL: Well-appearing, NAD NEURO/PSYCH:  Alert and oriented x 3, no focal deficits, no meningismus EYES:  eyes equal and reactive ENT/NECK:  no LAD, no JVD CARDIO: Regular rate,  well-perfused, normal S1 and S2 PULM:  CTAB no wheezing or rhonchi GI/GU:  non-distended, non-tender MSK/SPINE:  No gross deformities, no edema SKIN:  no rash, atraumatic   *Additional and/or pertinent findings included in MDM below  Diagnostic and Interventional Summary    EKG Interpretation Date/Time:   10/22/23 @ 05:32:58 Ventricular Rate:   70 PR Interval:   118 QRS Duration:   98 QT Interval:   378 QTC Calculation:  408 R Axis:      Text Interpretation:  Sinus rhythm normal intervals       Labs Reviewed  GROUP A STREP BY PCR  RESP PANEL BY RT-PCR (RSV, FLU A&B, COVID)  [id number]    DG Chest 2 View  Final Result      Medications - No data to display   Procedures  /  Critical Care Procedures  ED Course and Medical Decision Making  Initial Impression and Ddx Suggestive of viral illness, no leg pain or swelling, no evidence of DVT on exam, given endorsement of shortness of breath will obtain screening EKG and chest x-ray.  Swabbing for COVID flu RSV and strep.  No signs of abscess to the oropharynx  Past medical/surgical history that increases complexity of ED encounter: None  Interpretation of Diagnostics I personally reviewed  the EKG and my interpretation is as follows:  SR  Chest x-ray unremarkable, COVID flu and RSV negative, strep negative  Patient Reassessment and Ultimate Disposition/Management     No emergent process patient is appropriate for discharge.  Patient management required discussion with the following services or consulting groups:  None  Complexity of Problems Addressed Acute complicated illness or Injury  Additional Data Reviewed and Analyzed Further history obtained from: None  Additional Factors Impacting ED Encounter Risk None  Elmer Sow. Pilar Plate, MD [facility] Emergency Medicine [facility] [email or web address]  Final Clinical Impressions(s) / ED Diagnoses     ICD-10-CM   1. Viral illness  B34.9       ED  Discharge Orders     None        Discharge Instructions Discussed with and Provided to Patient:     Discharge Instructions      You were evaluated in the Emergency Department and after careful evaluation, we did not find any emergent condition requiring admission or further testing in the hospital.  Your exam/testing today is overall reassuring.  Symptoms likely due to a viral illness.  Your heart and lungs looked normal.  Recommend fluids, rest, Tylenol and Motrin for discomfort.  Please return to the Emergency Department if you experience any worsening of your condition.   Thank you for allowing [place] to be a part of your care.       Sabas Sous, MD 10/22/23 [phone]

## 2023-10-26 IMAGING — CT CT ABD-PELV W/ CM
3 of 4 series · 11 of 46 positions shown, 16 images · IV contrast (agent unspecified)
Comparison: Scrotal ultrasound April 14, 2022

CLINICAL DATA: Right lower quadrant and right testicular pain

EXAM:
CT ABDOMEN AND PELVIS WITH CONTRAST
TECHNIQUE: Multidetector CT imaging of the abdomen and pelvis was performed
using the standard protocol following bolus administration of
intravenous contrast.

[Series 3: abdomen 5.0 · axial · 0.90mm/px · z∈[+675,+1015]mm · 7 of 92 slices shown, 12 images]
[im 12/92  soft-tissue]
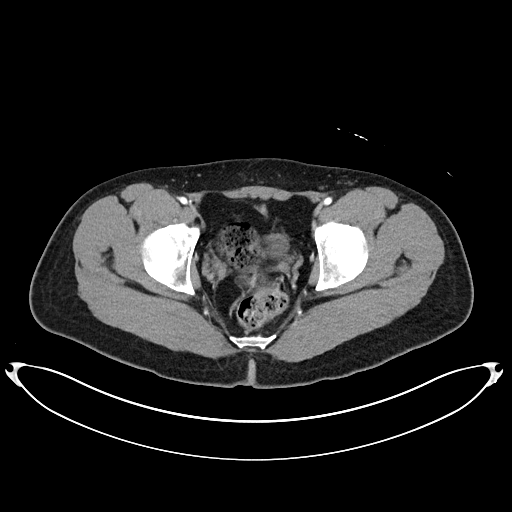
[im 12/92  bone]
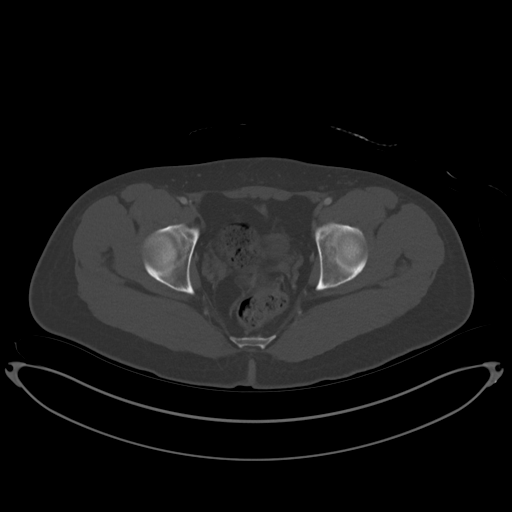
[im 23/92  soft-tissue]
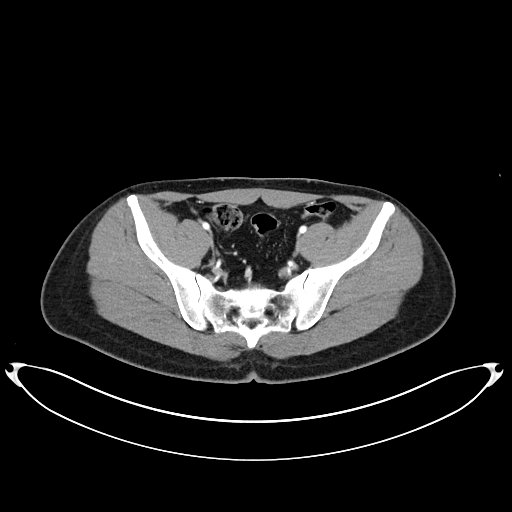
[im 35/92  soft-tissue]
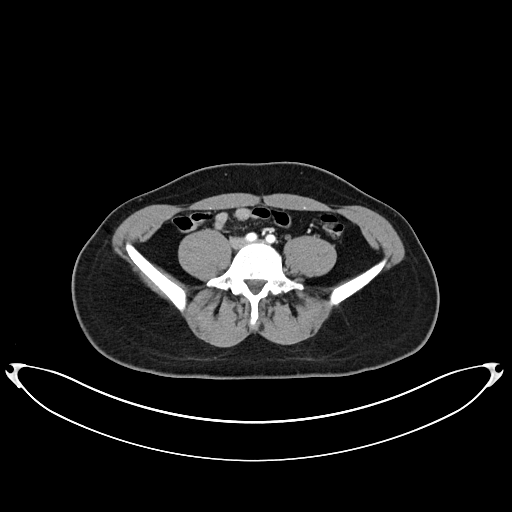
[im 46/92  soft-tissue]
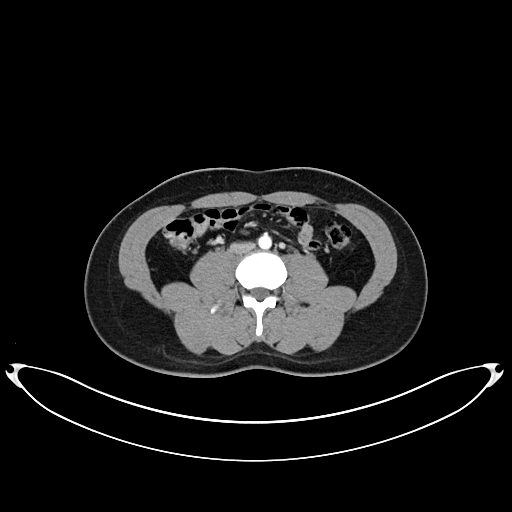
[im 46/92  lung]
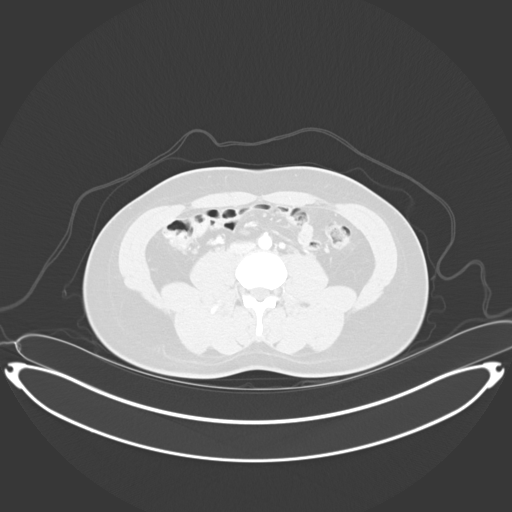
[im 57/92  soft-tissue]
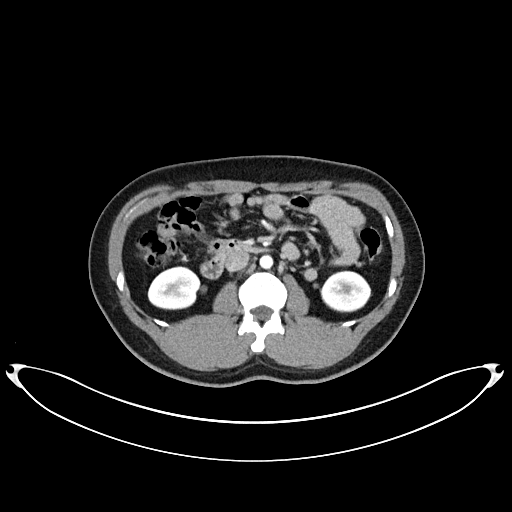
[im 57/92  lung]
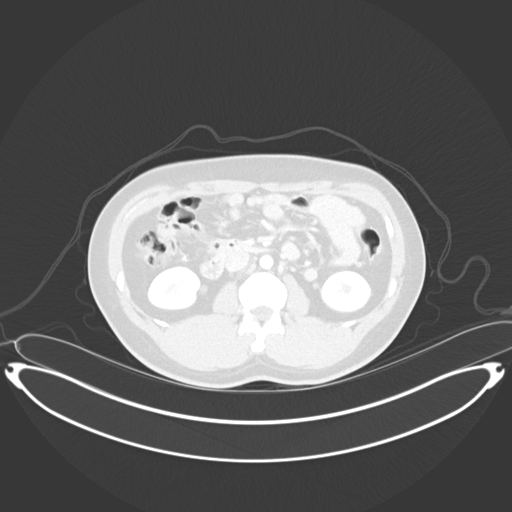
[im 69/92  soft-tissue]
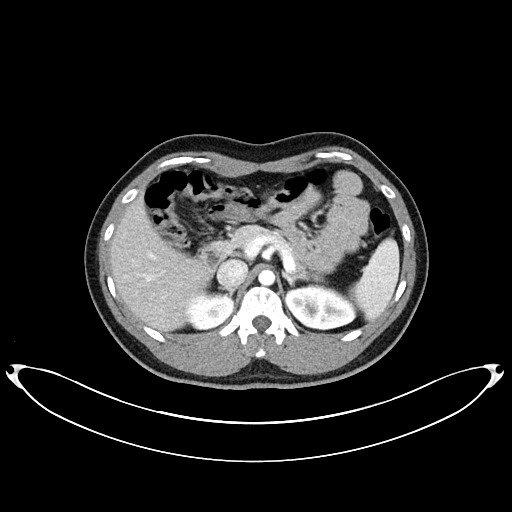
[im 69/92  lung]
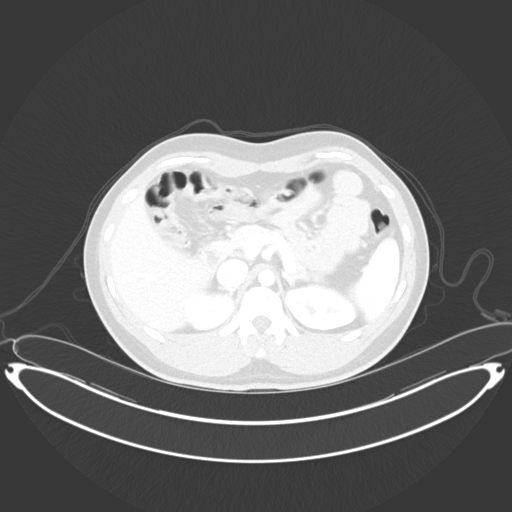
[im 80/92  soft-tissue]
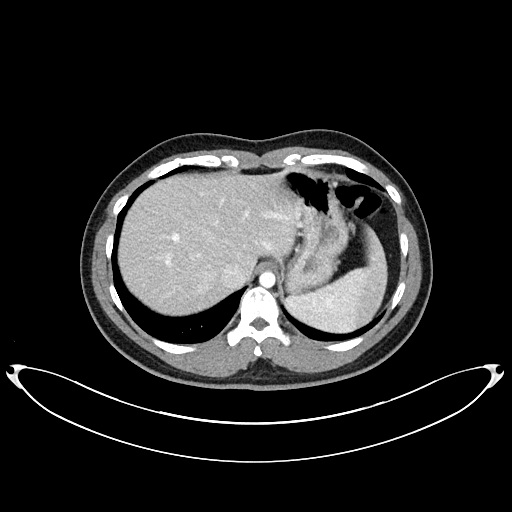
[im 80/92  lung]
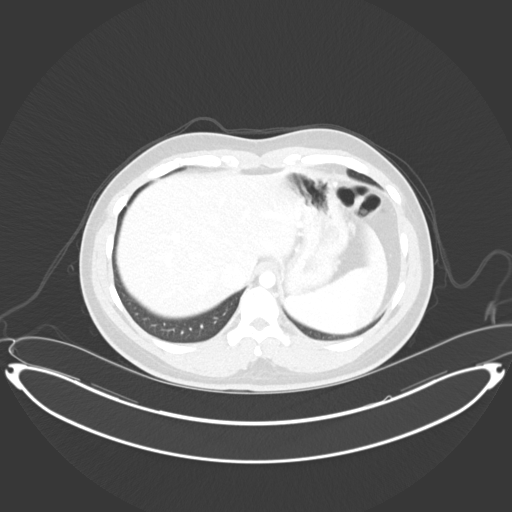

[Series 6: abdomen 3.0 mpr cor · coronal · 0.80mm/px · 3 of 81 slices shown]
[im 27/81  soft-tissue]
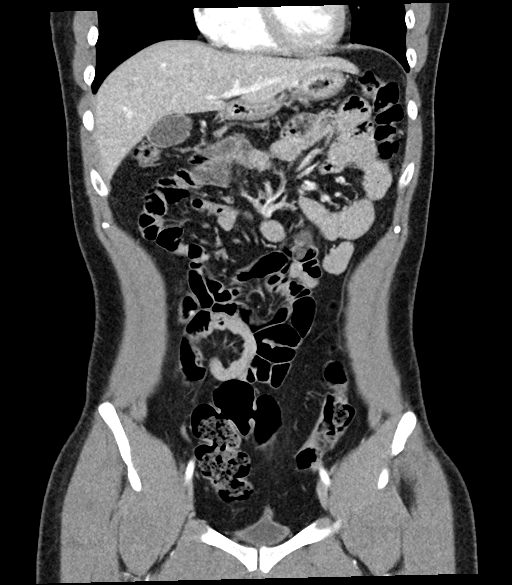
[im 36/81  soft-tissue]
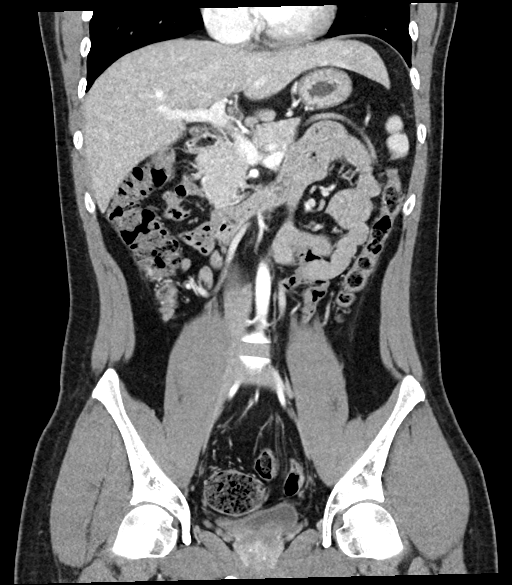
[im 45/81  soft-tissue]
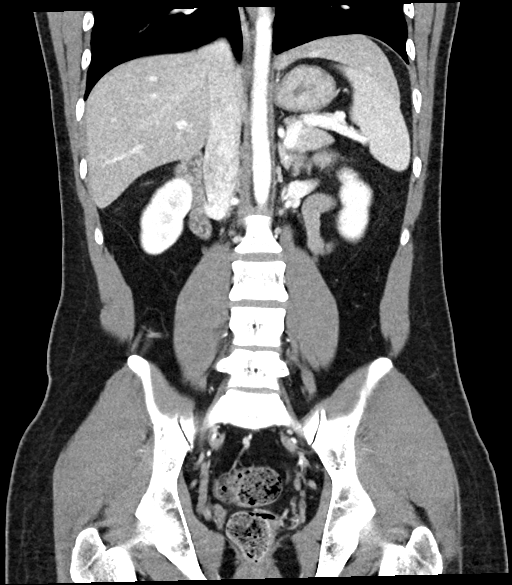

[Series 7: abdomen 3.0 mpr sag · sagittal · 0.47mm/px · 1 of 136 slices shown]
[im 46/136  soft-tissue]
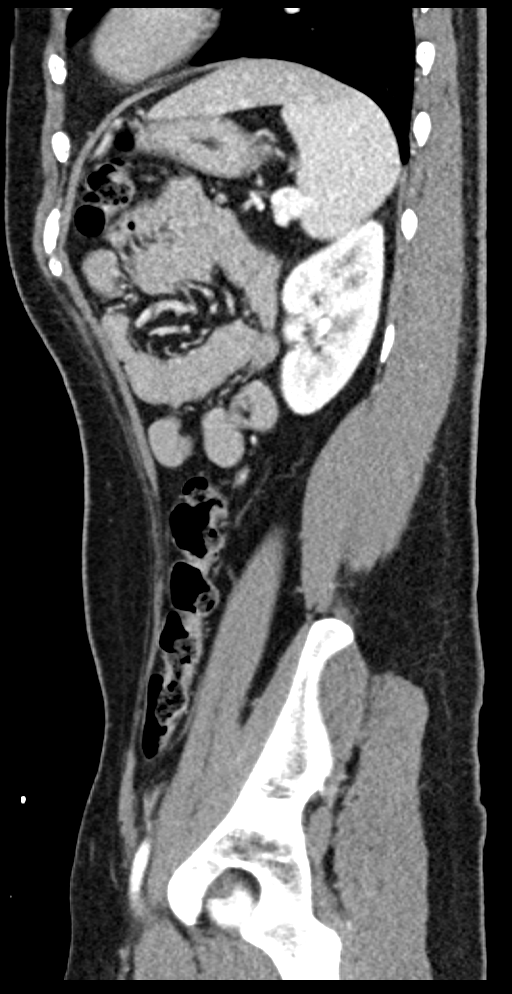

[11 of 46 positions shown; findings below may reference images not displayed]

RADIATION DOSE REDUCTION: This exam was performed according to the
departmental dose-optimization program which includes automated
exposure control, adjustment of the mA and/or kV according to
patient size and/or use of iterative reconstruction technique.

CONTRAST:  100mL OMNIPAQUE IOHEXOL 300 MG/ML  SOLN
FINDINGS: Lower chest: No acute abnormality.

Hepatobiliary: No suspicious hepatic lesion. Gallbladder is
unremarkable. No biliary ductal dilation.

Pancreas: No pancreatic ductal dilation or evidence of acute
inflammation.

Spleen: No splenomegaly or focal splenic lesion.

Adrenals/Urinary Tract: Bilateral adrenal glands appear normal. No
hydronephrosis. Kidneys demonstrate symmetric enhancement. Urinary
bladder is nondistended limiting evaluation.

Stomach/Bowel: No radiopaque enteric contrast material was
administered. Stomach is unremarkable for degree of distension. No
pathologic dilation of small or large bowel. The appendix and
terminal ileum appear normal. No evidence of acute bowel
inflammation.

Vascular/Lymphatic: Normal caliber abdominal aorta. No
pathologically enlarged abdominal or pelvic lymph nodes. Prominent
right lower quadrant lymph nodes measure up to 8 mm in short axis
are commonly reactive.

Reproductive: Prostate gland is unremarkable.

Other: No significant abdominopelvic free fluid. No walled off fluid
collections. No pneumoperitoneum.

Musculoskeletal: No acute or significant osseous findings.
IMPRESSION: 1. Prominent right lower quadrant mesenteric lymph nodes measure up
to 9 mm in short axis, possibly reflecting mesenteric adenitis.
2. Otherwise, no acute abnormality identified in the abdomen or
pelvis.

## 2023-10-26 IMAGING — US US SCROTUM W/ DOPPLER COMPLETE
1 series · 14 of 25 positions shown · non-contrast
Comparison: None Available.

CLINICAL DATA: Right testicular pain for the past 5 days.

EXAM:
SCROTAL ULTRASOUND
DOPPLER ULTRASOUND OF THE TESTICLES
TECHNIQUE: Complete ultrasound examination of the testicles, epididymis, and
other scrotal structures was performed. Color and spectral Doppler
ultrasound were also utilized to evaluate blood flow to the
testicles.

[Series 1: us scrotum w/doppler · 14 of 64 slices shown]
[im 1/64]
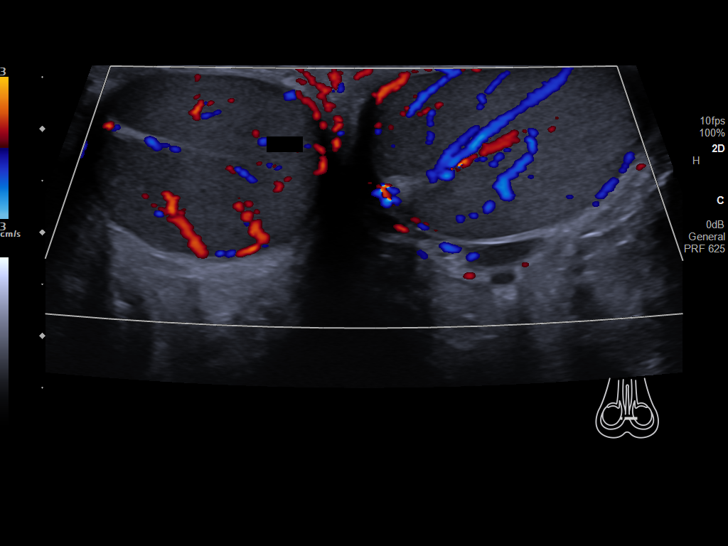
[im 6/64]
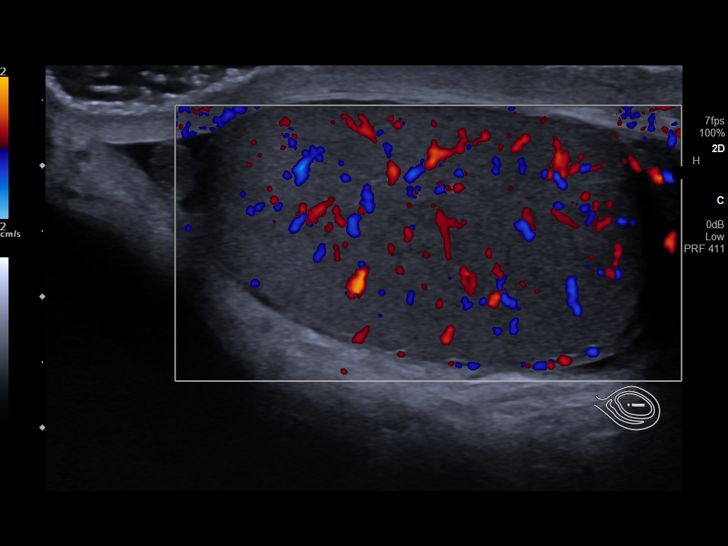
[im 11/64]
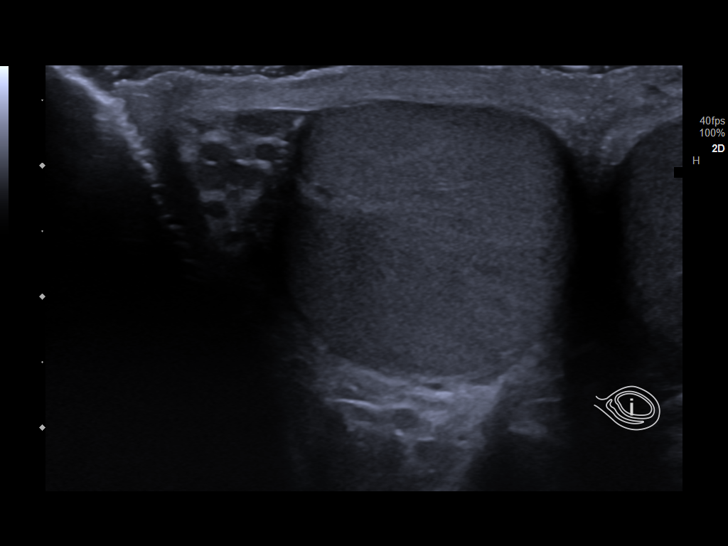
[im 16/64]
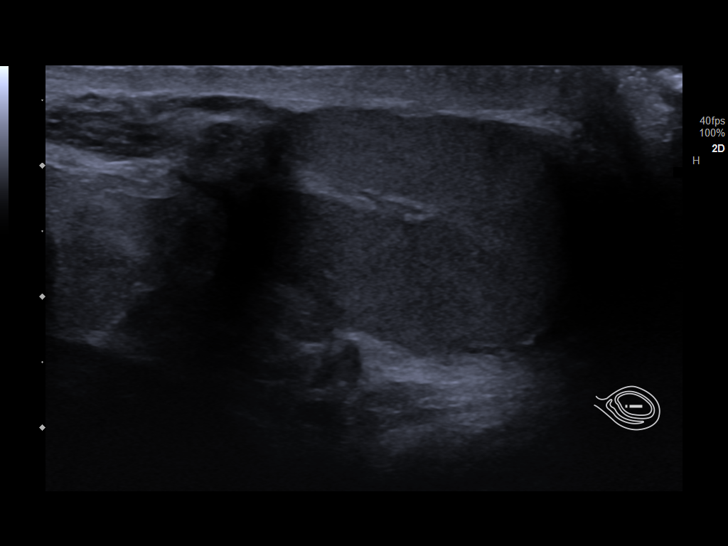
[im 22/64]
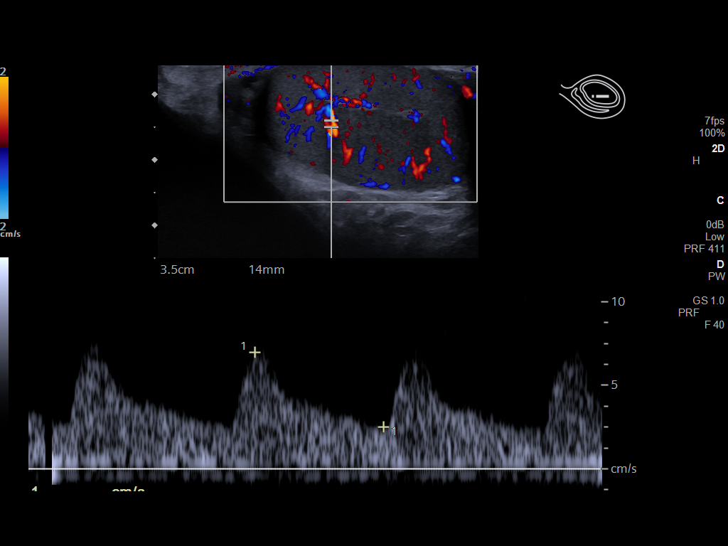
[im 24/64]
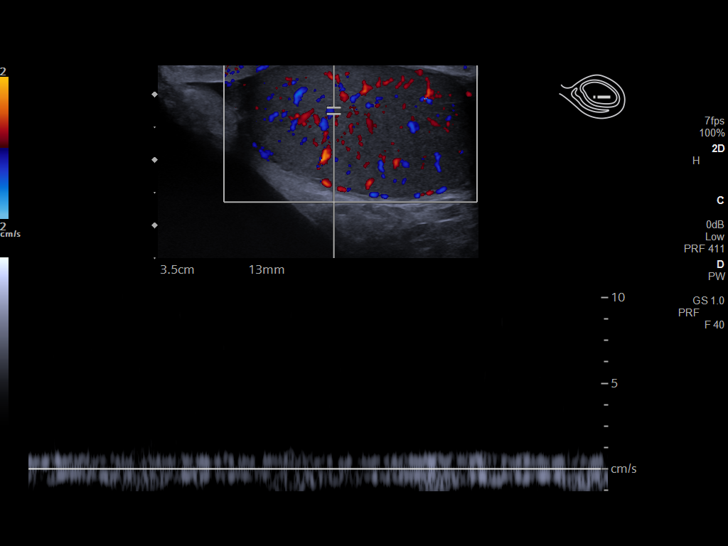
[im 29/64]
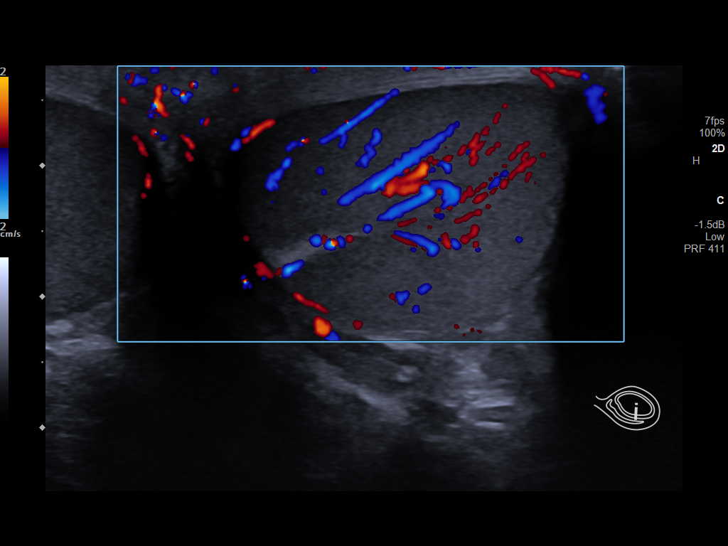
[im 35/64]
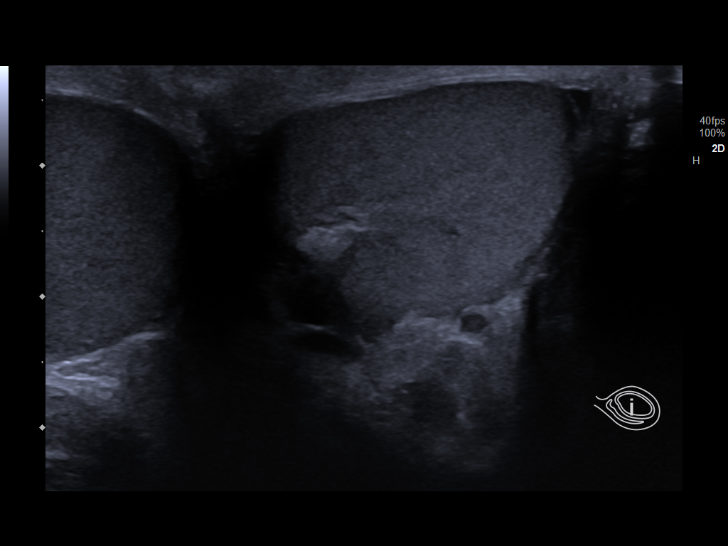
[im 40/64]
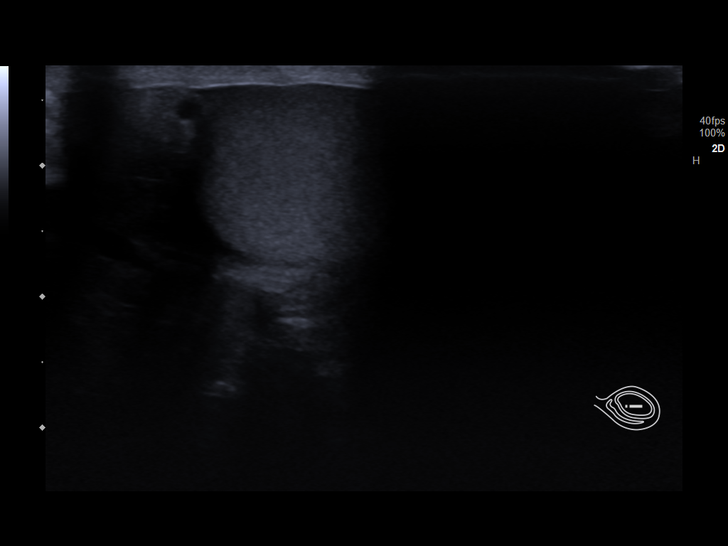
[im 43/64]
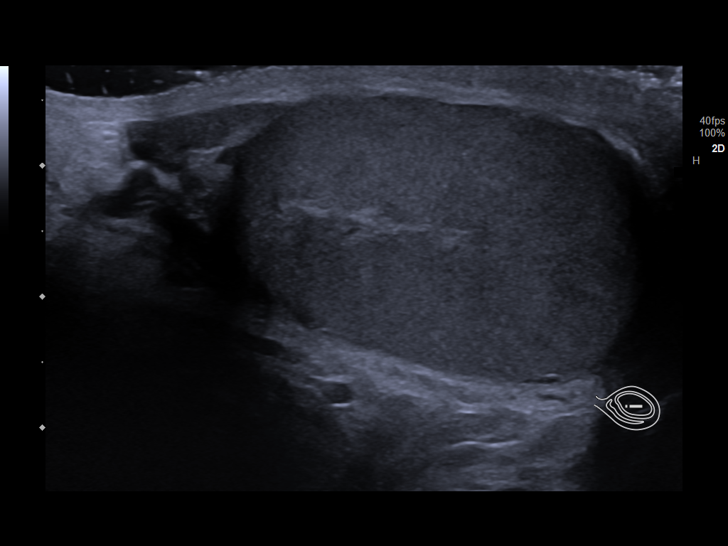
[im 48/64]
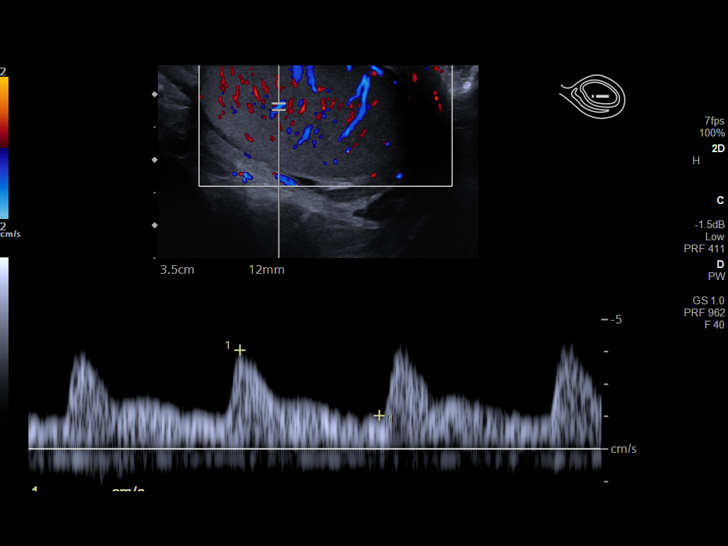
[im 53/64]
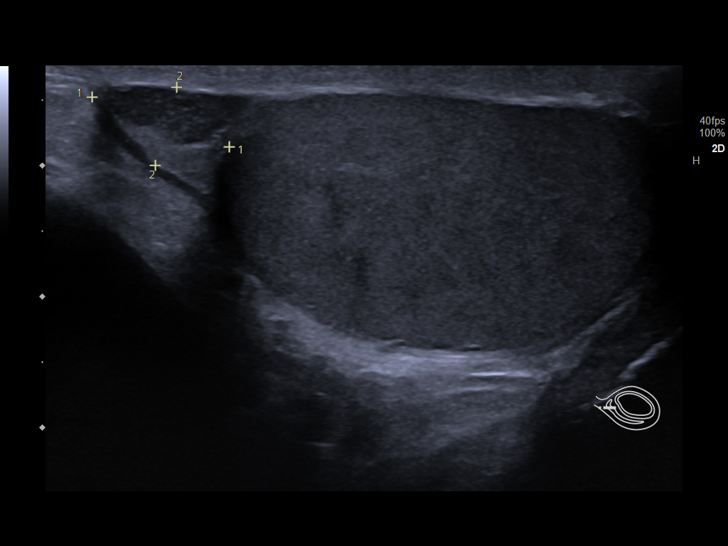
[im 58/64]
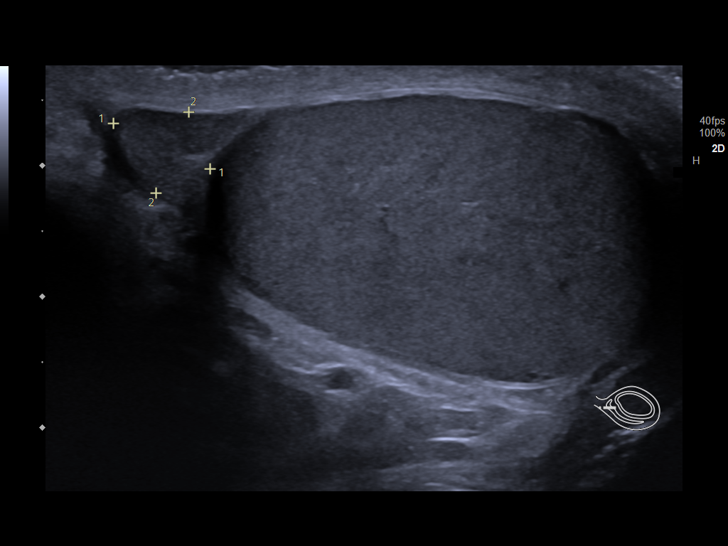
[im 64/64]
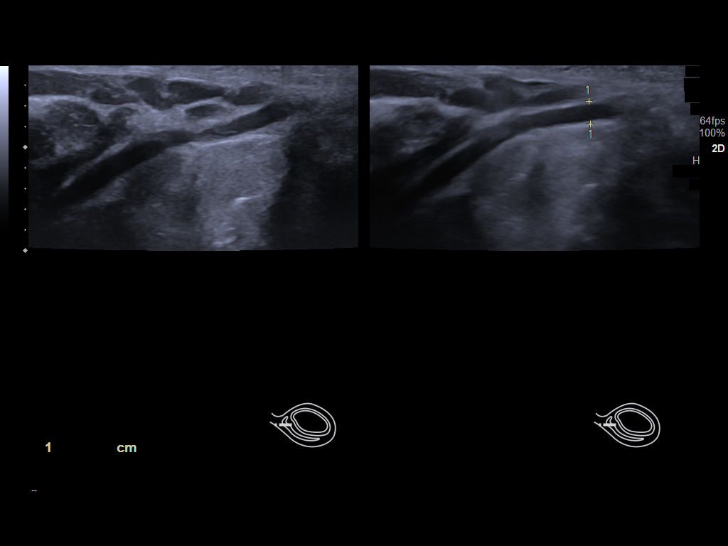

[14 of 25 positions shown; findings below may reference images not displayed]

FINDINGS: Right testicle

Measurements: 3.4 x 1.9 x 2.3 cm. No mass or microlithiasis
visualized.

Left testicle

Measurements: 3.3 x 1.8 x 2.5 cm. No mass or microlithiasis
visualized.

Right epididymis:  Normal in size and appearance.

Left epididymis:  Normal in size and appearance.

Hydrocele:  None visualized.

Varicocele:  None visualized.

Pulsed Doppler interrogation of both testes demonstrates normal low
resistance arterial and venous waveforms bilaterally.
IMPRESSION: 1. Normal testicular ultrasound.

## 2024-02-13 ENCOUNTER — Ambulatory Visit (HOSPITAL_COMMUNITY): Admission: EM | Admit: 2024-02-13 | Discharge: 2024-02-13 | Disposition: A | Discharge status: Home / Self Care

## 2024-02-13 ENCOUNTER — Encounter (HOSPITAL_COMMUNITY): Payer: Self-pay

## 2024-02-13 DIAGNOSIS — R112 Nausea with vomiting, unspecified: Secondary | ICD-10-CM

## 2024-02-13 DIAGNOSIS — R1084 Generalized abdominal pain: Secondary | ICD-10-CM

## 2024-02-13 NOTE — ED Provider Notes (Signed)
 [facility]    CSN: [id number] Arrival date & time: 02/13/24  1531      History   Chief Complaint Chief Complaint  Patient presents with   Abdominal Pain    HPI Jacob Ferguson is a 28 y.o. male.   Patient presents today for evaluation of generalized abdominal pain and nausea x 1 week.  He has tried taking an over-the-counter stomach relief medication with mild relief.  He has taken Tylenol with no relief.  He denies any fever.  The history is provided by the patient.  Abdominal Pain Associated symptoms: nausea   Associated symptoms: no chills, no constipation, no diarrhea, no fever and no shortness of breath     History reviewed. No pertinent past medical history.  There are no active problems to display for this patient.   History reviewed. No pertinent surgical history.     Home Medications    Prior to Admission medications   Medication Sig Start Date End Date Taking? Authorizing Provider  albuterol  (VENTOLIN  HFA) 108 (90 Base) MCG/ACT inhaler Inhale 1-2 puffs into the lungs every 6 (six) hours as needed for wheezing or shortness of breath. Patient not taking: Reported on 02/13/2024 05/09/22   Eloise Hake Scales, PA-C    Family History History reviewed. No pertinent family history.  Social History Social History   Tobacco Use   Smoking status: Never   Smokeless tobacco: Never  Vaping Use   Vaping status: Never Used  Substance Use Topics   Alcohol use: Not Currently   Drug use: Never     Allergies   Patient has no known allergies.   Review of Systems Review of Systems  Constitutional:  Negative for chills and fever.  Eyes:  Negative for discharge and redness.  Respiratory:  Negative for shortness of breath.   Gastrointestinal:  Positive for abdominal pain and nausea. Negative for blood in stool, constipation and diarrhea.     Physical Exam Triage Vital Signs ED Triage Vitals  Encounter Vitals Group     BP 02/13/24 1625 131/79      Systolic BP Percentile --      Diastolic BP Percentile --      Pulse Rate 02/13/24 1625 74     Resp 02/13/24 1625 16     Temp 02/13/24 1625 99.1 F (37.3 C)     Temp Source 02/13/24 1625 Oral     SpO2 02/13/24 1625 97 %     Weight 02/13/24 1625 170 lb (77.1 kg)     Height 02/13/24 1625 5' (1.524 m)     Head Circumference --      Peak Flow --      Pain Score 02/13/24 1623 5     Pain Loc --      Pain Education --      Exclude from Growth Chart --    No data found.  Updated Vital Signs BP 131/79 (BP Location: Right Arm)   Pulse 74   Temp 99.1 F (37.3 C) (Oral)   Resp 16   Ht 5' (1.524 m)   Wt 170 lb (77.1 kg)   SpO2 97%   BMI 33.20 kg/m   Visual Acuity Right Eye Distance:   Left Eye Distance:   Bilateral Distance:    Right Eye Near:   Left Eye Near:    Bilateral Near:     Physical Exam Vitals and nursing note reviewed.  Constitutional:      General: He is not in acute distress.  Appearance: Normal appearance. He is not ill-appearing.  HENT:     Head: Normocephalic and atraumatic.  Eyes:     Conjunctiva/sclera: Conjunctivae normal.  Cardiovascular:     Rate and Rhythm: Normal rate and regular rhythm.  Pulmonary:     Effort: Pulmonary effort is normal. No respiratory distress.  Abdominal:     General: Abdomen is flat. Bowel sounds are normal. There is no distension.     Palpations: Abdomen is soft.     Tenderness: There is abdominal tenderness (diffuse TTP with worsened TTP to RUQ and epigastric area). There is no guarding or rebound.  Neurological:     Mental Status: He is alert.  Psychiatric:        Mood and Affect: Mood normal.        Behavior: Behavior normal.        Thought Content: Thought content normal.      UC Treatments / Results  Labs (all labs ordered are listed, but only abnormal results are displayed) Labs Reviewed - No data to display  EKG   Radiology No results found.  Procedures Procedures (including critical care  time)  Medications Ordered in UC Medications - No data to display  Initial Impression / Assessment and Plan / UC Course  I have reviewed the triage vital signs and the nursing notes.  Pertinent labs & imaging results that were available during my care of the patient were reviewed by me and considered in my medical decision making (see chart for details).    Given significant tenderness on exam recommended further evaluation in the emergency room for stat imaging and labs.  Patient expresses understanding.  He will transport via POV.  Final Clinical Impressions(s) / UC Diagnoses   Final diagnoses:  Diffuse abdominal pain  Nausea and vomiting, unspecified vomiting type   Discharge Instructions   None    ED Prescriptions   None    PDMP not reviewed this encounter.   Vernestine Gondola, PA-C 02/13/24 [phone]

## 2024-02-13 NOTE — ED Triage Notes (Signed)
 Patient here today with c/o abd pain and nausea X 1 week. He has taken a stomach relief with some relief. He has also tried taking Tylenol with no relief.

## 2024-02-13 NOTE — ED Notes (Signed)
 Patient is being discharged from the Urgent Care and sent to the Emergency Department via private vehicle . Per provider, patient is in need of higher level of care due to abdominal pain. Patient is aware and verbalizes understanding of plan of care.  Vitals:   02/13/24 1625  BP: 131/79  Pulse: 74  Resp: 16  Temp: 99.1 F (37.3 C)  SpO2: 97%
